# Patient Record
Sex: Male | Born: 1935 | Race: White | Hispanic: No | Smoking: Former smoker
Health system: Southern US, Community
[De-identification: ages and names within clinical notes are randomized; demographics above are authoritative.]

## PROBLEM LIST (undated history)

## (undated) DIAGNOSIS — I10 Essential (primary) hypertension: Secondary | ICD-10-CM

## (undated) DIAGNOSIS — I252 Old myocardial infarction: Secondary | ICD-10-CM

## (undated) DIAGNOSIS — E119 Type 2 diabetes mellitus without complications: Secondary | ICD-10-CM

## (undated) DIAGNOSIS — E785 Hyperlipidemia, unspecified: Secondary | ICD-10-CM

## (undated) DIAGNOSIS — I493 Ventricular premature depolarization: Secondary | ICD-10-CM

## (undated) DIAGNOSIS — I639 Cerebral infarction, unspecified: Secondary | ICD-10-CM

## (undated) DIAGNOSIS — I4510 Unspecified right bundle-branch block: Secondary | ICD-10-CM

## (undated) HISTORY — PX: APPENDECTOMY: SHX54

## (undated) HISTORY — PX: HERNIA REPAIR: SHX51

## (undated) HISTORY — PX: TONSILLECTOMY: SUR1361

## (undated) HISTORY — PX: OTHER SURGICAL HISTORY: SHX169

---

## 2005-05-28 ENCOUNTER — Ambulatory Visit: Payer: Self-pay | Admitting: Internal Medicine

## 2006-01-27 ENCOUNTER — Ambulatory Visit: Payer: Self-pay | Admitting: Family Medicine

## 2006-02-13 ENCOUNTER — Ambulatory Visit: Payer: Self-pay | Admitting: Family Medicine

## 2006-03-15 ENCOUNTER — Ambulatory Visit: Payer: Self-pay | Admitting: Family Medicine

## 2007-04-13 ENCOUNTER — Ambulatory Visit: Payer: Self-pay | Admitting: Unknown Physician Specialty

## 2007-04-28 ENCOUNTER — Ambulatory Visit: Payer: Self-pay | Admitting: Pain Medicine

## 2007-05-31 ENCOUNTER — Ambulatory Visit: Payer: Self-pay | Admitting: Pain Medicine

## 2008-02-29 ENCOUNTER — Other Ambulatory Visit: Payer: Self-pay

## 2008-02-29 ENCOUNTER — Inpatient Hospital Stay: Payer: Self-pay | Admitting: Specialist

## 2008-03-12 ENCOUNTER — Emergency Department: Payer: Self-pay | Admitting: Emergency Medicine

## 2008-03-12 ENCOUNTER — Other Ambulatory Visit: Payer: Self-pay

## 2008-03-16 ENCOUNTER — Emergency Department: Payer: Self-pay | Admitting: Emergency Medicine

## 2009-03-16 ENCOUNTER — Observation Stay: Payer: Self-pay | Admitting: Internal Medicine

## 2009-03-24 ENCOUNTER — Emergency Department: Payer: Self-pay | Admitting: Internal Medicine

## 2009-04-04 ENCOUNTER — Emergency Department: Payer: Self-pay | Admitting: Emergency Medicine

## 2009-06-02 ENCOUNTER — Ambulatory Visit: Payer: Self-pay | Admitting: Sports Medicine

## 2009-07-25 ENCOUNTER — Ambulatory Visit: Payer: Self-pay | Admitting: Urology

## 2009-07-31 ENCOUNTER — Ambulatory Visit: Payer: Self-pay | Admitting: Urology

## 2010-07-22 ENCOUNTER — Ambulatory Visit: Payer: Self-pay | Admitting: Unknown Physician Specialty

## 2010-07-24 LAB — PATHOLOGY REPORT

## 2010-08-22 ENCOUNTER — Ambulatory Visit: Payer: Self-pay | Admitting: Internal Medicine

## 2012-08-16 DIAGNOSIS — N390 Urinary tract infection, site not specified: Secondary | ICD-10-CM | POA: Insufficient documentation

## 2012-08-16 DIAGNOSIS — R339 Retention of urine, unspecified: Secondary | ICD-10-CM | POA: Insufficient documentation

## 2012-08-16 DIAGNOSIS — N138 Other obstructive and reflux uropathy: Secondary | ICD-10-CM | POA: Insufficient documentation

## 2012-08-16 DIAGNOSIS — R310 Gross hematuria: Secondary | ICD-10-CM | POA: Insufficient documentation

## 2012-12-03 ENCOUNTER — Inpatient Hospital Stay: Payer: Self-pay | Admitting: Internal Medicine

## 2012-12-03 LAB — COMPREHENSIVE METABOLIC PANEL
Anion Gap: 9 (ref 7–16)
Bilirubin,Total: 1.5 mg/dL — ABNORMAL HIGH (ref 0.2–1.0)
Calcium, Total: 8.1 mg/dL — ABNORMAL LOW (ref 8.5–10.1)
Chloride: 101 mmol/L (ref 98–107)
Co2: 24 mmol/L (ref 21–32)
EGFR (African American): >60
Potassium: 3.9 mmol/L (ref 3.5–5.1)
SGOT(AST): 22 U/L (ref 15–37)
SGPT (ALT): 18 U/L (ref 12–78)
Sodium: 134 mmol/L — ABNORMAL LOW (ref 136–145)
Total Protein: 6.6 g/dL (ref 6.4–8.2)

## 2012-12-03 LAB — URINALYSIS, COMPLETE
Glucose,UR: NEGATIVE mg/dL (ref 0–75)
Nitrite: POSITIVE
Ph: 5 (ref 4.5–8.0)
Protein: 30
RBC,UR: 2 /HPF (ref 0–5)
WBC UR: 2 /HPF (ref 0–5)

## 2012-12-03 LAB — CBC WITH DIFFERENTIAL/PLATELET
Eosinophil #: 0 10*3/uL (ref 0.0–0.7)
HCT: 40.3 % (ref 40.0–52.0)
HGB: 13.9 g/dL (ref 13.0–18.0)
Lymphocyte %: 2.7 %
MCHC: 34.4 g/dL (ref 32.0–36.0)
Monocyte #: 2.1 x10 3/mm — ABNORMAL HIGH (ref 0.2–1.0)
Neutrophil #: 16.6 10*3/uL — ABNORMAL HIGH (ref 1.4–6.5)
Neutrophil %: 86.2 %
RBC: 4.23 10*6/uL — ABNORMAL LOW (ref 4.40–5.90)
RDW: 13 % (ref 11.5–14.5)

## 2012-12-03 LAB — CK TOTAL AND CKMB (NOT AT ARMC): CK-MB: <0.5 ng/mL — ABNORMAL LOW (ref 0.5–3.6)

## 2012-12-04 LAB — BASIC METABOLIC PANEL
Anion Gap: 6 — ABNORMAL LOW (ref 7–16)
Calcium, Total: 8.3 mg/dL — ABNORMAL LOW (ref 8.5–10.1)
Chloride: 103 mmol/L (ref 98–107)
Co2: 28 mmol/L (ref 21–32)
Creatinine: 0.88 mg/dL (ref 0.60–1.30)
EGFR (African American): >60
EGFR (Non-African Amer.): >60
Potassium: 3.9 mmol/L (ref 3.5–5.1)

## 2012-12-04 LAB — CBC WITH DIFFERENTIAL/PLATELET
Basophil #: 0 10*3/uL (ref 0.0–0.1)
Eosinophil #: 0 10*3/uL (ref 0.0–0.7)
HCT: 38.7 % — ABNORMAL LOW (ref 40.0–52.0)
HGB: 12.8 g/dL — ABNORMAL LOW (ref 13.0–18.0)
Lymphocyte #: 1 10*3/uL (ref 1.0–3.6)
MCHC: 33 g/dL (ref 32.0–36.0)
Monocyte #: 2 x10 3/mm — ABNORMAL HIGH (ref 0.2–1.0)
Monocyte %: 10.2 %
Neutrophil #: 16.4 10*3/uL — ABNORMAL HIGH (ref 1.4–6.5)
Neutrophil %: 84.8 %
Platelet: 155 10*3/uL (ref 150–440)
RBC: 4.04 10*6/uL — ABNORMAL LOW (ref 4.40–5.90)
RDW: 13.1 % (ref 11.5–14.5)

## 2012-12-05 LAB — CBC WITH DIFFERENTIAL/PLATELET
Basophil #: 0 10*3/uL (ref 0.0–0.1)
Basophil %: 0.3 %
Eosinophil %: 0.1 %
HCT: 39.2 % — ABNORMAL LOW (ref 40.0–52.0)
HGB: 13.6 g/dL (ref 13.0–18.0)
Lymphocyte #: 0.6 10*3/uL — ABNORMAL LOW (ref 1.0–3.6)
Lymphocyte %: 4.1 %
MCH: 33.1 pg (ref 26.0–34.0)
MCHC: 34.7 g/dL (ref 32.0–36.0)
MCV: 95 fL (ref 80–100)
Monocyte %: 8.9 %
Neutrophil #: 12.3 10*3/uL — ABNORMAL HIGH (ref 1.4–6.5)
Neutrophil %: 86.6 %
Platelet: 150 10*3/uL (ref 150–440)
RDW: 13.2 % (ref 11.5–14.5)

## 2012-12-06 LAB — URINE CULTURE

## 2012-12-09 LAB — CULTURE, BLOOD (SINGLE)

## 2013-05-17 ENCOUNTER — Ambulatory Visit: Payer: Self-pay | Admitting: Ophthalmology

## 2013-05-25 ENCOUNTER — Ambulatory Visit: Payer: Self-pay | Admitting: Ophthalmology

## 2013-06-22 ENCOUNTER — Ambulatory Visit: Payer: Self-pay | Admitting: Ophthalmology

## 2013-08-17 DIAGNOSIS — R972 Elevated prostate specific antigen [PSA]: Secondary | ICD-10-CM | POA: Insufficient documentation

## 2013-08-17 DIAGNOSIS — N3020 Other chronic cystitis without hematuria: Secondary | ICD-10-CM | POA: Insufficient documentation

## 2013-12-08 ENCOUNTER — Observation Stay: Payer: Self-pay | Admitting: Internal Medicine

## 2013-12-08 LAB — BASIC METABOLIC PANEL
ANION GAP: 6 — AB (ref 7–16)
BUN: 15 mg/dL (ref 7–18)
CO2: 28 mmol/L (ref 21–32)
CREATININE: 1.08 mg/dL (ref 0.60–1.30)
Calcium, Total: 8.5 mg/dL (ref 8.5–10.1)
Chloride: 103 mmol/L (ref 98–107)
EGFR (African American): >60
GFR CALC NON AF AMER: 59 — AB
Glucose: 117 mg/dL — ABNORMAL HIGH (ref 65–99)
Osmolality: 276 (ref 275–301)
Potassium: 4.2 mmol/L (ref 3.5–5.1)
Sodium: 137 mmol/L (ref 136–145)

## 2013-12-08 LAB — CBC
HCT: 41.6 % (ref 40.0–52.0)
HGB: 14.1 g/dL (ref 13.0–18.0)
MCH: 32.3 pg (ref 26.0–34.0)
MCHC: 33.8 g/dL (ref 32.0–36.0)
MCV: 96 fL (ref 80–100)
PLATELETS: 180 10*3/uL (ref 150–440)
RBC: 4.35 10*6/uL — ABNORMAL LOW (ref 4.40–5.90)
RDW: 13.5 % (ref 11.5–14.5)
WBC: 6.5 10*3/uL (ref 3.8–10.6)

## 2013-12-08 LAB — HEPATIC FUNCTION PANEL A (ARMC)
ALT: 20 U/L (ref 12–78)
Albumin: 3.7 g/dL (ref 3.4–5.0)
Alkaline Phosphatase: 73 U/L
Bilirubin, Direct: 0.2 mg/dL (ref 0.00–0.20)
Bilirubin,Total: 0.8 mg/dL (ref 0.2–1.0)
SGOT(AST): 21 U/L (ref 15–37)
TOTAL PROTEIN: 7.1 g/dL (ref 6.4–8.2)

## 2013-12-08 LAB — MAGNESIUM: MAGNESIUM: 1.9 mg/dL

## 2013-12-08 LAB — TROPONIN I: Troponin-I: <0.02 ng/mL

## 2013-12-08 LAB — D-DIMER(ARMC): D-Dimer: 628 ng/ml

## 2013-12-09 LAB — T4, FREE: Free Thyroxine: 0.91 ng/dL (ref 0.76–1.46)

## 2013-12-09 LAB — TROPONIN I

## 2013-12-09 LAB — TSH: Thyroid Stimulating Horm: 5 u[IU]/mL — ABNORMAL HIGH

## 2014-09-13 ENCOUNTER — Ambulatory Visit: Payer: Self-pay | Admitting: Neurology

## 2015-01-05 NOTE — H&P (Signed)
PATIENT NAME:  Benjamin Parks, Benjamin Parks MR#:  [id number] DATE OF BIRTH:  20-Sep-1918  DATE OF ADMISSION:  12/03/2012  PRIMARY CARE PHYSICIAN:  Dr. Tera MaterJim Hedrick.   UROLOGY:  Dr. Assunta GamblesBrian Cope.   REQUESTING PHYSICIAN:  Dr. Maricela BoLuna Ragsdale.   CHIEF COMPLAINT:  Shortness of breath.   HISTORY OF PRESENT ILLNESS:  The patient is a 81 year old male with a known history of CVA, diet-controlled diabetes, is being admitted for bilateral pneumonia.  He lives at [facility] where he had some blood in the urine, had a urology office visit who requested CT scan of the abdomen with IV contrast which he got it done on the 18th, had a follow-up appointment with Dr. Achilles Dunkope on the 19th who did not find any abnormality.  He did not have any kidney stones as per patient information.  The patient started having some uncomfortable feeling all day on the 20th and started having some trouble breathing.  He was feeling that he likely did not clear some of the dye that he received for the CT scan, so started drinking a lot of fluid, but now his breathing was getting worse and decided to come to the Emergency Department.  While in the ED he was found to be hypoxic with room air oxygen saturations of 93% at rest and 91% on ambulation.  He was found to have bilateral pneumonia on chest x-ray and is being admitted for further evaluation and management.   PAST MEDICAL HISTORY: 1.  Hyperlipidemia.  2.  History of TIA/CVA.  3.  Hypertension.  4.  Diet-controlled diabetes.   PAST SURGICAL HISTORY:  None.   MEDICATIONS AT HOME: 1.  Aggrenox 1 tablet by mouth twice daily.  2.  Atenolol 12.5 mg by mouth daily.  3.  Citalopram 10 mg by mouth daily.  4.  Finasteride 5 mg by mouth daily. 5.  Zetia 10 mg by mouth daily.   ALLERGIES:  ERYTHROMYCIN.   SOCIAL HISTORY:  He lives at [facility] with his wife who has a lot of heart problems.  He is a nonsmoker.  Occasional alcohol use.  No IV drugs of abuse.    FAMILY HISTORY:  Both parents died at an old age, possibly of heart disease.   REVIEW OF SYSTEMS: CONSTITUTIONAL:  No fever.  Positive for fatigue and weakness.  EYES:  No blurred or double vision.  EARS, NOSE, THROAT:  No tinnitus or ear pain.  RESPIRATORY:  No cough, wheezing, hemoptysis.  Positive for shortness of breath.  CARDIOVASCULAR:  No chest pain, orthopnea, edema.  GASTROINTESTINAL:  No nausea, vomiting, diarrhea. GENITOURINARY:  No dysuria, hematuria. ENDOCRINE:  No polyuria or nocturia.  HEMATOLOGY:  No anemia or easy bruising.  SKIN:  No rash or lesion.  MUSCULOSKELETAL:  No arthritis or muscle cramp.  NEUROLOGIC:  No tingling, numbness, weakness.  History of TIA and/or CVA.  PSYCHIATRIC:  No history of anxiety or depression.   PHYSICAL EXAMINATION: VITAL SIGNS:  Temperature 97.4, heart rate 67 per minute, respirations 16 per minute, blood pressure 113/50 mmHg.  He is saturating 91% on room air and 94% to 95% on 2 liters oxygen by nasal cannula.  GENERAL:  The patient is a 81 year old male lying in the bed comfortably without any acute distress.  EYES: Pupils equal, round, reactive to light and accommodation.  No scleral icterus.  Extraocular muscles intact.  HENT:  Head atraumatic, normocephalic.  Oropharynx and nasopharynx clear.  NECK:  Supple.  No jugular  venous distention.  No thyroid enlargement or tenderness.  LUNGS:  Clear to auscultation bilaterally.  No rales, rhonchi or crepitation.  CARDIOVASCULAR:  S1 and S2 normal.  No murmurs, rubs or gallop.  ABDOMEN:  Soft, nontender, nondistended.  Bowel sounds present.  No organomegaly, masses.  EXTREMITIES:  No pedal edema, cyanosis, clubbing.  NEUROLOGIC:  Cranial nerves II through XII intact.  Muscle strength 5 out of 5 in all extremities.  Sensation intact. PSYCHIATRIC:  The patient is oriented to time, place, and person x 3.  SKIN:  No obvious rash or lesion.   LABORATORY DATA:  Normal BMP.  Normal liver  function tests except total bilirubin of 1.5.  Normal first set of cardiac enzymes.  Normal CBC except white count of 19.3.  UA showed 3+ leukocyte esterase, positive nitrite, 2+ WBCs, 2+ bacteria.    Chest x-ray shows mild basilar infiltrate.    EKG shows normal sinus rhythm, right bundle branch block.  No major ST-T changes.  His EKG is unchanged from his previous EKG back in July of 2010.   IMPRESSION AND PLAN:   1.  Pneumonia, bilateral, was started on IV Levaquin.  Obtain blood and sputum culture.  We will check urine Legionella antigen.  Also check mycoplasma antigen.  We will also check influenza test.  2.  Urinary tract infection, based on UA.  We will check urine culture, sensitivity.  3.  Diabetes, diet-controlled.  We will check hemoglobin A1c.  4.  History of transient ischemic attack/cerebrovascular accident.  We will continue Aggrenox and Zetia.  5.  Hyperlipidemia.  We will continue Zetia.   CODE STATUS:  FULL CODE.   TOTAL TIME TAKING CARE OF THIS PATIENT:  Is 55 minutes.     ____________________________ Ellamae Sia. Sherryll Burger, MD vss:ea D: 12/03/2012 22:40:51 ET T: 12/04/2012 03:56:26 ET JOB#: [id number]  cc: Shadoe Bethel S. Sherryll Burger, MD, <Dictator> Rhona Leavens. Burnett Sheng, MD Ellamae Sia [facility] MD ELECTRONICALLY SIGNED 12/05/2012 16:23

## 2015-01-05 NOTE — Op Note (Signed)
PATIENT NAME:  Benjamin Parks, Benjamin Parks MR#:  [id number] DATE OF BIRTH:  1918-10-28  DATE OF PROCEDURE:  05/25/2013  PREOPERATIVE DIAGNOSIS:  Senile cataract, right eye.  POSTOPERATIVE DIAGNOSIS:  Senile cataract, right eye.  PROCEDURE:  Phacoemulsification with posterior chamber intraocular lens implantation of the right eye.  LENS:  ZCBOO 24.0-diopter posterior chamber intraocular lens.  ULTRASOUND TIME:  19% of 1 minute, 56 seconds.  CDE 21.3.  SURGEON:  It[place]had Tramel Westbrook, MD  ANESTHESIA:  Topical with tetracaine drops and 2% Xylocaine jelly.  COMPLICATIONS:  None.  DESCRIPTION OF PROCEDURE:  The patient was identified in the holding room and transported to the operating room and placed in the supine position under the operating microscope.  The right eye was identified as the operative eye and it was prepped and draped in the usual sterile ophthalmic fashion.  A 1 millimeter clear-corneal paracentesis was made at the 12 o'clock position.  The anterior chamber was filled with Viscoat viscoelastic.  A 2.4 millimeter keratome was used to make a near-clear corneal incision at the 9 o'clock position.  A curvilinear capsulorrhexis was made with a cystotome and capsulorrhexis forceps.  Balanced salt solution was used to hydrodissect and hydrodelineate the nucleus.  Phacoemulsification was then used in stop and chop fashion to remove the lens nucleus and epinucleus.  The remaining cortex was then removed using the irrigation and aspiration handpiece. Provisc was then placed into the capsular bag to distend it for lens placement.  A ZCBOO 24.0-diopter lens was then injected into the capsular bag.  The remaining viscoelastic was aspirated.  Wounds were hydrated with balanced salt solution.  The anterior chamber was inflated to a physiologic pressure with balanced salt solution.  0.1 mL of cefuroxime 10 mg/mL were injected into the anterior chamber for a dose of 1 mg of intracameral antibiotic at the  completion of the case. Miostat was placed into the anterior chamber to constrict the pupil.  No wound leaks were noted.  Topical Vigamox drops and Maxitrol ointment were applied to the eye.  The patient was taken to the recovery room in stable condition without complications of anesthesia or surgery.    ____________________________ Deirdre Evenerhadwick R. Javonnie Illescas, MD crb:dm D: 05/25/2013 14:42:46 ET T: 05/25/2013 14:51:31 ET JOB#: [id number]  cc: Deirdre Evenerhadwick R. Myrl Bynum, MD, <Dictator>   Lockie MolaHADWICK Laia Wiley MD ELECTRONICALLY SIGNED 06/01/2013 10:58

## 2015-01-05 NOTE — Discharge Summary (Signed)
PATIENT NAME:  Alysia PennaLMER, Christen MR#:  [id number] DATE OF BIRTH:  November 30, 1918  DATE OF ADMISSION:  12/03/2012 DATE OF DISCHARGE:  12/05/2012  PRIMARY CARE PHYSICIAN:  Dr. Jerl MinaJames Hedrick.   FINAL DIAGNOSES: 1.  Pneumonia and leukocytosis.  2.  Suspected urinary tract infection.  3.  Hypertension.  4.  History of cerebrovascular accident.  5.  Benign prostatic hypertrophy.  6.  Hyperlipidemia.   MEDICATIONS ON DISCHARGE: Include:  Finasteride 5 mg daily, Celexa 10 mg daily, Zetia 10 mg daily, Aggrenox 1 tablet twice a day, atenolol 12.5 mg daily, levofloxacin 500 mg 1 tablet daily for 8 more days.   ACTIVITY: As tolerated.   FOLLOWUP: In 1 to 2 weeks with Dr. Jerl MinaJames Hedrick.   REASON FOR ADMISSION: The patient was admitted December 03, 2012, and discharged December 05, 2012, came in with shortness of breath.   HISTORY OF PRESENT ILLNESS: The patient is a 81 year old man, history of CVA, came in with shortness of breath and was found to have bilateral pneumonia. He was admitted to the hospital and started on IV Levaquin.   LABORATORY AND RADIOLOGICAL DATA DURING THE HOSPITAL COURSE: Included a chest x-ray showed mild bibasal infiltrate. Urine culture colonies too small to read. Urinalysis 3+ leukocyte esterase, positive nitrites. Troponin negative. Glucose 192, BUN 15, creatinine 0.95, sodium 134, potassium 3.9, chloride 101, CO2 24, calcium 8.1, total bilirubin 1.5, alkaline phosphatase 61, ALT 18, AST 22, albumin 3.3. White blood cell count 19.3, H and H 13.9 and 40.3, platelet count 157. Blood cultures negative. Influenza negative. White count upon discharge 14.1.   HOSPITAL COURSE PER PROBLEM LIST:  1.  For the patient's pneumonia and leukocytosis, the patient was started on IV Levaquin. He was switched over to oral Levaquin. His white count lessened a bit, down to 14 upon discharge. The patient was feeling better. The patient had a temperature of 99.2 upon discharge, heart rate 64, blood pressure  119/76. The patient's vital signs were stable. The patient was clinically stable, discharged home in satisfactory condition.  2.  For suspected UTI, urine culture did not grow out anything, colonies too small to grow Levaquin would cover anyway.  3.  Hypertension. The patient is on atenolol.  4.  History of CVA. On Aggrenox.  5.  BPH, on finasteride.  6.  Hyperlipidemia, on Zetia.   TIME SPENT ON DISCHARGE: 35 minutes.     ____________________________ Herschell Dimesichard J. Renae GlossWieting, MD rjw:cs D: 12/05/2012 13:50:28 ET T: 12/05/2012 14:50:56 ET JOB#: [id number]  cc: Herschell Dimesichard J. Renae GlossWieting, MD, <Dictator> Rhona LeavensJames F. Burnett ShengHedrick, MD Salley ScarletICHARD J Jakyia Gaccione MD ELECTRONICALLY SIGNED 12/15/2012 10:30

## 2015-01-05 NOTE — Op Note (Signed)
PATIENT NAME:  Benjamin Parks, Benjamin Parks MR#:  [id number] DATE OF BIRTH:  01/14/1919  DATE OF PROCEDURE:  06/22/2013  PREOPERATIVE DIAGNOSIS:  Senile cataract, left eye.  POSTOPERATIVE DIAGNOSIS:  Senile cataract, left eye.  PROCEDURE:  Phacoemulsification with posterior chamber intraocular lens implantation of the left eye.  LENS:  ZCB00 24.5-diopter posterior chamber intraocular lens.  ULTRASOUND TIME:  10% of 1 minute, 25 seconds.  CDE 8.1.  SURGEON:  Italyhad Tyniesha Howald, MD  ANESTHESIA:  Topical with tetracaine drops and 2% Xylocaine jelly.  COMPLICATIONS:  None.  DESCRIPTION OF PROCEDURE:  The patient was identified in the holding room and transported to the operating room and placed in the supine position under the operating microscope.  The left eye was identified as the operative eye and it was prepped and draped in the usual sterile ophthalmic fashion.  A 1 millimeter clear-corneal paracentesis was made at the 1:30 position.  The anterior chamber was filled with  Viscoat viscoelastic.  A 2.4 millimeter keratome was used to make a near-clear corneal incision at the  10:30 position.  A curvilinear capsulorrhexis was made with a cystotome and capsulorrhexis forceps.  Balanced salt solution was used to hydrodissect and hydrodelineate the nucleus.  Phacoemulsification was then used in stop and chop fashion to remove the lens nucleus and epinucleus.  The remaining cortex was then removed using the irrigation and aspiration handpiece. Provisc was then placed into the capsular bag to distend it for lens placement.  A ZCB00 24.5-diopter lens was then injected into the capsular bag.  The remaining viscoelastic was aspirated.  Wounds were hydrated with balanced salt solution.  The anterior chamber was inflated to a physiologic pressure with balanced salt solution.  0.1 mL of cefuroxime 10 mg/mL were injected into the anterior chamber for a dose of 1 mg of intracameral antibiotic at the completion of the  case. Miostat was placed into the anterior chamber to constrict the pupil.  No wound leaks were noted.  Topical Vigamox drops and Maxitrol ointment were applied to the eye.  The patient was taken to the recovery room in stable condition without complications of anesthesia or surgery.    ____________________________ Deirdre Evenerhadwick R. Evangelene Vora, MD crb:dmm D: 06/22/2013 15:25:00 ET T: 06/22/2013 16:32:05 ET JOB#: [id number]  cc: Deirdre Evenerhadwick R. Graciela Plato, MD, <Dictator> Lockie MolaHADWICK Nichael Ehly MD ELECTRONICALLY SIGNED 06/29/2013 12:48

## 2015-01-06 NOTE — H&P (Signed)
PATIENT NAME:  Benjamin Parks, Benjamin Parks MR#:  [id number] DATE OF BIRTH:  [birth year]  DATE OF ADMISSION:  12/08/2013  REFERRING PHYSICIAN:  Girtha HakeGotlib.  PRIMARY CARE PHYSICIAN:  Hedrick.   CHIEF COMPLAINT: Shortness of breath.   HISTORY OF PRESENT ILLNESS: This is a 81 year old Caucasian gentleman with past medical history of hyperlipidemia, cerebral vascular accident without residual deficit, presented with shortness of breath. Events of the day, the patient was singing in choir practice and noticed difficulty " getting air in and catching his breath. " He manually checked his pulse and noted that he was "missing beats." Thus presented to the Emergency Department for further work-up and evaluation. He denies any further symptoms. No chest pain. No cough. No active shortness of breath. This event lasted a few minutes only. He denies any cough. Denies any immobilization, recent surgeries, active cancer. He is a nontobacco user. He denies any lower extremity edema. Currently he is without complaints stating that his symptoms had completely resolved by the time he arrived to the Emergency Department.   REVIEW OF SYSTEMS:   CONSTITUTIONAL: Denies fever, fatigue, weakness.  EYES:  Denies blurry, double vision or eye pain.  EARS:   Denies tinnitus, ear pain or hearing loss.   RESPIRATORY: Denies cough, wheeze. Positive for transient short of breath as described above.  CARDIOVASCULAR: No chest pain, palpitations, edema.  GASTROINTESTINAL: Denies nausea, vomiting, diarrhea or vomiting.  GENITOURINARY: Denies dysuria, hematuria.  ENDOCRINE: Denies nocturia or thyroid problem.  Denies easy bruising or bleeding.  SKIN: Denies rash or lesions.  MUSCULOSKELETAL: Denies pain in neck, back, shoulder, knees, hips or arthritis symptoms.  NEUROLOGIC: Denies paralysis or paresthesias.  PSYCHIATRIC: Denies anxiety or depressive symptoms.  Otherwise, full review of systems performed and is negative.    PAST MEDICAL HISTORY:  Diabetes, diet controlled, BPH, CVA hyperlipidemia.   SOCIAL HISTORY: Positive for alcohol usage occasionally. Denies any tobacco or drug usage.   FAMILY HISTORY: Positive for coronary artery disease.   ALLERGIES: SHELLFISH.   HOME MEDICATIONS: Finasteride 5 mg p.o. daily, citalopram 10 mg p.o. daily, Zetia 10 mg p.o. daily, Aggrenox 25/200 mg p.o. b.i.d., atenolol 25 mg half tablet daily though he states he is taking this at 25 mg half tablet b.i.d.   PHYSICAL EXAMINATION:  VITAL SIGNS: Temperature 97, heart rate on arrival 42, currently 63, respirations 20, blood pressure 150/57, saturating 98% on room air. Weight 77.1 kg.  BMI 24.4.  GENERAL: Well-nourished, well-developed, Caucasian gentleman, currently in no acute distress.  HEAD: Normocephalic, atraumatic.  EYES: Pupils equal, round and reactive to light. Extraocular muscles are intact. No scleral icterus.  MOUTH: Moist mucous membranes. Dentition intact. No abscess noted.  EARS, NOSE and THROAT: No exudates. No external lesions.   NECK: Supple. No thyromegaly. No nodules. No JVD.  PULMONARY:  Clear to auscultation bilaterally without wheezes, rales or rhonchi. No use of accessory muscles. Good respiratory effort.  CHEST:  Nontender to palpation.  CARDIOVASCULAR: S1, S2, bradycardic without murmurs, rubs, or gallops. No edema. Pedal pulses are 2+ bilaterally.  GASTROINTESTINAL: Soft, nontender, nondistended. No masses. Normal bowel sounds.  No hepatosplenomegaly.  MUSCULOSKELETAL: No swelling, clubbing, edema. Range of motion is full in all extremities.  NEUROLOGIC:  Cranial nerves II-XII are intact. No gross neurological deficits. Sensation intact. Reflexes intact.  SKIN:  No ulceration, lesions, rashes, cyanosis. Skin warm and dry. Turgor intact.  PSYCHIATRIC: Mood and affect within normal limits. The patient alert and oriented x 3. Insight and judgment intact.    LABORATORY  DATA: EKG performed on arrival revealing sinus  bradycardia. Heart rate of 53 with right bundle branch and occasional PVCs.   Remainder of laboratory data: Sodium 137, potassium 4.2, chloride 103, bicarbonate 28, BUN 15, creatinine . Glucose 117. LFTs within normal limits. Troponin I less than 0.02. WBC 6.5, hemoglobin 14.1, platelets 180,000.   D-dimer 628 with interpretation error of 628.   Chest x-ray performed revealing no acute cardiopulmonary process.   ASSESSMENT AND PLAN: A 81 year old gentleman with history of hyperlipidemia, cerebral vascular accident without residual deficits, shortness of breath as described as difficulty catching his breath with heart rate in the 40s on arrival to the Emergency Department.  1.  Symptomatic bradycardia with heart rate in 40s on arrival to the Emergency Department now in the 60s without intervention. We will hold atenolol and further AV nodal agents. His medication is prescribed per our records as atenolol 12.5 mg daily. He states he has been taking this 12.5 mg b.i.d. This may be the underlying problem. We will place on telemetry under observation status. Check TSH. Trend cardiac enzymes. Check magnesium level.  2.  Positive D-dimer, well score of zero. History and physical are not conducive to pulmonary embolism or deep vein thrombosis. I feel that this likely does not require any further work-up and discussed with the patient at length.  3.  Cerebrovascular accident history. Continue Aggrenox.  4.  Deep venous thrombosis  continue heparin subcutaneous.   CODE STATUS:  The patient is full code.   TIME SPENT: 45 minutes   ____________________________ Cletis Athens. Hower, MD dkh:tc D: 12/08/2013 20:34:41 ET T: 12/08/2013 21:24:47 ET JOB#: [id number]  cc: Cletis Athens. Hower, MD, <Dictator> DAVID Synetta Shadow MD ELECTRONICALLY SIGNED 12/09/2013 0:04

## 2015-01-06 NOTE — Discharge Summary (Signed)
PATIENT NAME:  Benjamin Parks, Benjamin Parks MR#:  [id number] DATE OF BIRTH:  1919/06/27  DATE OF ADMISSION:  12/08/2013 DATE OF DISCHARGE:  12/09/2013  PRIMARY CARE PHYSICIAN: Rhona LeavensJames F. Burnett ShengHedrick, MD  DISCHARGE DIAGNOSES:   1.  Symptomatic bradycardia.  2.  Hypertension. 3.  History of cerebrovascular accident.  CONDITION: Stable.   CODE STATUS: DNR.   HOME MEDICATIONS: Please refer to the medication reconciliation list.   DIET: Low-sodium, low-fat, low-cholesterol diet.   ACTIVITY: As tolerated.   FOLLOWUP CARE: Follow up with PCP within 1 to 2 weeks.   REASON FOR ADMISSION: Shortness of breath.   HOSPITAL COURSE: The patient is a [age] year old Caucasian male with a history of hyperlipidemia, CVA, who presented to the ED with shortness of breath. He manually checked his pulses and noted that he was missing beats, so he came to ED for further evaluation. For detailed history and physical examination, please refer to the admission note dictated by Dr. Clint GuyHower. In ED, the patient denied any symptoms. No chest pain, cough, or shortness of breath. Laboratory data on admission date are as follows: EKG showed sinus bradycardia at 53 with a right bundle branch block with occasional PVC. Electrolytes were normal. Chest x-ray did not show any acute cardiopulmonary process.  1.  Symptomatic bradycardia with heart rate at 40s on arrival in ED, then increased to about 60s without intervention. The patient was on atenolol 12.5 mg daily; however, the patient is taking b.i.d. Atenolol was on hold after admission. The patient's heart rate has been stable at about 60s.  2.  Positive D-dimer. The patient's D-dimer is 628. We will resume atenolol 12.5 mg p.o. daily.  3.  History of CVA, on Aggrenox.   The patient had no complaints after admission. His vital signs are stable. He is clinically stable and will be discharged to home today. I discussed the patient's discharge plan with the patient, nurse, case manager.   TIME  SPENT: About 33 minutes.  ____________________________ Benjamin PollackQing Lindel Marcell, MD qc:jcm D: 12/09/2013 15:32:56 ET T: 12/09/2013 18:26:11 ET JOB#: [id number]  cc: Benjamin PollackQing Alhassan Everingham, MD, <Dictator> Benjamin PollackQING Merlina Marchena MD ELECTRONICALLY SIGNED 12/10/2013 14:18

## 2015-11-07 ENCOUNTER — Encounter: Payer: Self-pay | Admitting: *Deleted

## 2015-11-07 ENCOUNTER — Inpatient Hospital Stay
Admit: 2015-11-07 | Discharge: 2015-11-07 | Disposition: A | Discharge status: Home / Self Care | Payer: Medicare Other | Attending: Internal Medicine | Admitting: Internal Medicine

## 2015-11-07 ENCOUNTER — Emergency Department: Payer: Medicare Other

## 2015-11-07 ENCOUNTER — Inpatient Hospital Stay
Admission: EM | Admit: 2015-11-07 | Discharge: 2015-11-09 | DRG: 280 | Disposition: A | Discharge status: Home / Self Care | Payer: Medicare Other | Attending: Internal Medicine | Admitting: Internal Medicine

## 2015-11-07 DIAGNOSIS — Z7901 Long term (current) use of anticoagulants: Secondary | ICD-10-CM | POA: Diagnosis not present

## 2015-11-07 DIAGNOSIS — I214 Non-ST elevation (NSTEMI) myocardial infarction: Secondary | ICD-10-CM | POA: Diagnosis present

## 2015-11-07 DIAGNOSIS — I4510 Unspecified right bundle-branch block: Secondary | ICD-10-CM | POA: Diagnosis present

## 2015-11-07 DIAGNOSIS — J189 Pneumonia, unspecified organism: Secondary | ICD-10-CM | POA: Diagnosis present

## 2015-11-07 DIAGNOSIS — M25519 Pain in unspecified shoulder: Secondary | ICD-10-CM | POA: Diagnosis present

## 2015-11-07 DIAGNOSIS — E1110 Type 2 diabetes mellitus with ketoacidosis without coma: Secondary | ICD-10-CM

## 2015-11-07 DIAGNOSIS — Z881 Allergy status to other antibiotic agents status: Secondary | ICD-10-CM | POA: Diagnosis not present

## 2015-11-07 DIAGNOSIS — Z7902 Long term (current) use of antithrombotics/antiplatelets: Secondary | ICD-10-CM

## 2015-11-07 DIAGNOSIS — E119 Type 2 diabetes mellitus without complications: Secondary | ICD-10-CM | POA: Diagnosis present

## 2015-11-07 DIAGNOSIS — Z7984 Long term (current) use of oral hypoglycemic drugs: Secondary | ICD-10-CM | POA: Diagnosis not present

## 2015-11-07 DIAGNOSIS — Z6825 Body mass index (BMI) 25.0-25.9, adult: Secondary | ICD-10-CM

## 2015-11-07 DIAGNOSIS — I10 Essential (primary) hypertension: Secondary | ICD-10-CM | POA: Diagnosis present

## 2015-11-07 DIAGNOSIS — I493 Ventricular premature depolarization: Secondary | ICD-10-CM | POA: Diagnosis present

## 2015-11-07 DIAGNOSIS — W19XXXA Unspecified fall, initial encounter: Secondary | ICD-10-CM | POA: Diagnosis present

## 2015-11-07 DIAGNOSIS — Z7982 Long term (current) use of aspirin: Secondary | ICD-10-CM

## 2015-11-07 DIAGNOSIS — I249 Acute ischemic heart disease, unspecified: Secondary | ICD-10-CM

## 2015-11-07 DIAGNOSIS — Z8673 Personal history of transient ischemic attack (TIA), and cerebral infarction without residual deficits: Secondary | ICD-10-CM | POA: Diagnosis not present

## 2015-11-07 DIAGNOSIS — Z66 Do not resuscitate: Secondary | ICD-10-CM | POA: Diagnosis present

## 2015-11-07 DIAGNOSIS — E785 Hyperlipidemia, unspecified: Secondary | ICD-10-CM | POA: Diagnosis present

## 2015-11-07 DIAGNOSIS — I209 Angina pectoris, unspecified: Secondary | ICD-10-CM

## 2015-11-07 DIAGNOSIS — Z79899 Other long term (current) drug therapy: Secondary | ICD-10-CM

## 2015-11-07 HISTORY — DX: Hyperlipidemia, unspecified: E78.5

## 2015-11-07 HISTORY — DX: Type 2 diabetes mellitus without complications: E11.9

## 2015-11-07 HISTORY — DX: Cerebral infarction, unspecified: I63.9

## 2015-11-07 HISTORY — DX: Essential (primary) hypertension: I10

## 2015-11-07 HISTORY — DX: Unspecified right bundle-branch block: I45.10

## 2015-11-07 HISTORY — DX: Ventricular premature depolarization: I49.3

## 2015-11-07 LAB — BASIC METABOLIC PANEL
ANION GAP: 8 (ref 5–15)
BUN: 16 mg/dL (ref 6–20)
CO2: 27 mmol/L (ref 22–32)
Calcium: 9.1 mg/dL (ref 8.9–10.3)
Chloride: 103 mmol/L (ref 101–111)
Creatinine, Ser: 0.85 mg/dL (ref 0.61–1.24)
GFR calc Af Amer: >60 mL/min (ref >60)
GLUCOSE: 164 mg/dL — AB (ref 65–99)
POTASSIUM: 3.9 mmol/L (ref 3.5–5.1)
Sodium: 138 mmol/L (ref 135–145)

## 2015-11-07 LAB — GLUCOSE, CAPILLARY
GLUCOSE-CAPILLARY: 139 mg/dL — AB (ref 65–99)
GLUCOSE-CAPILLARY: 162 mg/dL — AB (ref 65–99)
Glucose-Capillary: 105 mg/dL — ABNORMAL HIGH (ref 65–99)

## 2015-11-07 LAB — CBC
HEMATOCRIT: 46.2 % (ref 40.0–52.0)
HEMOGLOBIN: 15.5 g/dL (ref 13.0–18.0)
MCH: 30.9 pg (ref 26.0–34.0)
MCHC: 33.5 g/dL (ref 32.0–36.0)
MCV: 92.2 fL (ref 80.0–100.0)
Platelets: 201 10*3/uL (ref 150–440)
RBC: 5.02 MIL/uL (ref 4.40–5.90)
RDW: 13.3 % (ref 11.5–14.5)
WBC: 5.8 10*3/uL (ref 3.8–10.6)

## 2015-11-07 LAB — LIPID PANEL
CHOL/HDL RATIO: 3.7 ratio
Cholesterol: 164 mg/dL (ref 0–200)
HDL: 44 mg/dL (ref >40)
LDL CALC: 100 mg/dL — AB (ref 0–99)
Triglycerides: 98 mg/dL (ref <150)
VLDL: 20 mg/dL (ref 0–40)

## 2015-11-07 LAB — HEMOGLOBIN A1C: Hgb A1c MFr Bld: 7.2 % — ABNORMAL HIGH (ref 4.0–6.0)

## 2015-11-07 LAB — MRSA PCR SCREENING: MRSA BY PCR: NEGATIVE

## 2015-11-07 LAB — TROPONIN I
TROPONIN I: 7.58 ng/mL — AB (ref <0.031)
Troponin I: 0.1 ng/mL — ABNORMAL HIGH (ref <0.031)
Troponin I: 2.45 ng/mL — ABNORMAL HIGH (ref <0.031)
Troponin I: 12.29 ng/mL — ABNORMAL HIGH (ref <0.031)

## 2015-11-07 MED ORDER — ENOXAPARIN SODIUM 80 MG/0.8ML ~~LOC~~ SOLN
1.0000 mg/kg | Freq: Two times a day (BID) | SUBCUTANEOUS | Status: DC
  Administered 2015-11-07 – 2015-11-09 (×5): 80 mg via SUBCUTANEOUS
  Filled 2015-11-07 (×8): qty 0.8

## 2015-11-07 MED ORDER — MORPHINE SULFATE (PF) 2 MG/ML IV SOLN
INTRAVENOUS | Status: AC
  Administered 2015-11-07: 2 mg via INTRAVENOUS
  Filled 2015-11-07: qty 1

## 2015-11-07 MED ORDER — LEVOFLOXACIN IN D5W 750 MG/150ML IV SOLN
750.0000 mg | INTRAVENOUS | Status: DC
  Administered 2015-11-07: 750 mg via INTRAVENOUS
  Filled 2015-11-07 (×2): qty 150

## 2015-11-07 MED ORDER — NITROGLYCERIN 0.4 MG SL SUBL
0.4000 mg | SUBLINGUAL_TABLET | SUBLINGUAL | Status: DC | PRN
  Administered 2015-11-09 (×2): 0.4 mg via SUBLINGUAL
  Filled 2015-11-07: qty 1

## 2015-11-07 MED ORDER — ASPIRIN EC 81 MG PO TBEC
81.0000 mg | DELAYED_RELEASE_TABLET | Freq: Every day | ORAL | Status: DC
  Administered 2015-11-08 – 2015-11-09 (×3): 81 mg via ORAL
  Filled 2015-11-07 (×3): qty 1

## 2015-11-07 MED ORDER — MORPHINE SULFATE (PF) 2 MG/ML IV SOLN
2.0000 mg | Freq: Once | INTRAVENOUS | Status: AC
  Administered 2015-11-07: 2 mg via INTRAVENOUS

## 2015-11-07 MED ORDER — ASPIRIN 300 MG RE SUPP: 300.0000 mg | RECTAL | Status: AC

## 2015-11-07 MED ORDER — INSULIN ASPART 100 UNIT/ML ~~LOC~~ SOLN
0.0000 [IU] | Freq: Three times a day (TID) | SUBCUTANEOUS | Status: DC
  Administered 2015-11-08: 2 [IU] via SUBCUTANEOUS
  Filled 2015-11-07 (×2): qty 2

## 2015-11-07 MED ORDER — DOCUSATE SODIUM 100 MG PO CAPS: 100.0000 mg | ORAL_CAPSULE | Freq: Every day | ORAL | Status: DC | PRN

## 2015-11-07 MED ORDER — SODIUM CHLORIDE 0.9 % IV SOLN
INTRAVENOUS | Status: DC
  Administered 2015-11-07 – 2015-11-08 (×3): via INTRAVENOUS

## 2015-11-07 MED ORDER — INSULIN ASPART 100 UNIT/ML ~~LOC~~ SOLN: 0.0000 [IU] | Freq: Every day | SUBCUTANEOUS | Status: DC

## 2015-11-07 MED ORDER — METOPROLOL TARTRATE 25 MG PO TABS
25.0000 mg | ORAL_TABLET | Freq: Two times a day (BID) | ORAL | Status: DC
  Administered 2015-11-07 – 2015-11-09 (×3): 25 mg via ORAL
  Filled 2015-11-07 (×3): qty 1

## 2015-11-07 MED ORDER — ONDANSETRON HCL 4 MG/2ML IJ SOLN: 4.0000 mg | Freq: Four times a day (QID) | INTRAMUSCULAR | Status: DC | PRN

## 2015-11-07 MED ORDER — ASPIRIN 81 MG PO CHEW: 324.0000 mg | CHEWABLE_TABLET | ORAL | Status: AC

## 2015-11-07 MED ORDER — MORPHINE SULFATE (PF) 2 MG/ML IV SOLN
2.0000 mg | INTRAVENOUS | Status: DC | PRN
  Administered 2015-11-07 – 2015-11-09 (×3): 2 mg via INTRAVENOUS
  Filled 2015-11-07 (×3): qty 1

## 2015-11-07 MED ORDER — ATORVASTATIN CALCIUM 20 MG PO TABS
20.0000 mg | ORAL_TABLET | Freq: Every day | ORAL | Status: DC
  Administered 2015-11-07: 20 mg via ORAL
  Filled 2015-11-07: qty 1

## 2015-11-07 MED ORDER — ACETAMINOPHEN 325 MG PO TABS: 650.0000 mg | ORAL_TABLET | ORAL | Status: DC | PRN

## 2015-11-07 NOTE — ED Notes (Signed)
MD at bedside. 

## 2015-11-07 NOTE — ED Provider Notes (Signed)
[facility] Emergency Department Provider Note  ____________________________________________  Time seen: Approximately 2:20 AM  I have reviewed the triage vital signs and the nursing notes.   HISTORY  Chief Complaint Chest Pain    HPI Benjamin Parks is a 82 y.o. male who comes into the hospital today with chest pain. The patient reports that he has some left-sided chest pain that started around midnight. The patient was laying in bed when the pain started. He has never had pain like this in the past. He does endorse some shortness of breath but denies nausea vomiting or dizziness. He has not had some sweats either. The patient reports that the pain feels like a pressure and he rates it 8 out of 10 in intensity. He was given aspirin as well as nitroglycerin paste by EMS but he feels that the pain is getting worse and worse. The patient did have a fall on Saturday and fell on his back but not on his chest or his side. The patient took some tramadol before going to bed for some shoulder pain but does not think that had anything to do with his symptoms.   Past Medical History  Diagnosis Date  . Right bundle branch block   . PVC (premature ventricular contraction)   . Diabetes mellitus without complication (HCC)   . Hypertension   . Hyperlipidemia   . Stroke [facility])     Patient Active Problem List   Diagnosis Date Noted  . Non-STEMI (non-ST elevated myocardial infarction) (HCC) 11/07/2015  . Community acquired pneumonia 11/07/2015    Past Surgical History  Procedure Laterality Date  . None      No current outpatient prescriptions on file.  Allergies Azithromycin  Family History  Problem Relation Age of Onset  . Heart attack Mother     deceased  . Heart attack Father     deceased    Social History Social History  Substance Use Topics  . Smoking status: Never Smoker   . Smokeless tobacco: None  . Alcohol Use: 0.0 oz/week    0 Standard drinks or  equivalent per week     Comment: one cocktail every day.    Review of Systems Constitutional: No fever/chills Eyes: No visual changes. ENT: No sore throat. Cardiovascular:  chest pain. Respiratory:  shortness of breath. Gastrointestinal: No abdominal pain.  No nausea, no vomiting.  No diarrhea.  No constipation. Genitourinary: Negative for dysuria. Musculoskeletal: Negative for back pain. Skin: Negative for rash. Neurological: Negative for headaches, focal weakness or numbness.  10-point ROS otherwise negative.  ____________________________________________   PHYSICAL EXAM:  VITAL SIGNS: ED Triage Vitals  Enc Vitals Group     BP 11/07/15 0154 175/95 mmHg     Pulse Rate 11/07/15 0154 78     Resp 11/07/15 0154 21     Temp --      Temp src --      SpO2 11/07/15 0154 99 %     Weight 11/07/15 0154 180 lb (81.647 kg)     Height 11/07/15 0154  (1.778 m)     Head Cir --      Peak Flow --      Pain Score 11/07/15 0155 7     Pain Loc --      Pain Edu? --      Excl. in GC? --     Constitutional: Alert and oriented. Well appearing and in moderate distress. Eyes: Conjunctivae are normal. PERRL. EOMI. Head: Atraumatic. Nose:  No congestion/rhinnorhea. Mouth/Throat: Mucous membranes are moist.  Oropharynx non-erythematous. Cardiovascular: Normal rate, regular rhythm. Grossly normal heart sounds.  Good peripheral circulation. Respiratory: Normal respiratory effort.  No retractions. Lungs CTAB. Gastrointestinal: Soft and nontender. No distention. Positive bowel sounds Musculoskeletal: No lower extremity tenderness nor edema.   Neurologic:  Normal speech and language.  Skin:  Skin is warm, dry and intact.  Psychiatric: Mood and affect are normal.   ____________________________________________   LABS (all labs ordered are listed, but only abnormal results are displayed)  Labs Reviewed  BASIC METABOLIC PANEL - Abnormal; Notable for the following:    Glucose, Bld 164 (*)     All other components within normal limits  TROPONIN I - Abnormal; Notable for the following:    Troponin I 0.10 (*)    All other components within normal limits  CBC   ____________________________________________  EKG  ED ECG REPORT I, Rebecka Apley, the attending physician, personally viewed and interpreted this ECG.   Date: 11/07/2015  EKG Time: 149  Rate: 74  Rhythm: normal sinus rhythm  Axis: normal  Intervals:right bundle branch block  ST&T Change: flipped t wave v2, III  ____________________________________________  RADIOLOGY  Chest x-ray: Mild basilar opacities may reflect mild pneumonia ____________________________________________   PROCEDURES  Procedure(s) performed: None  Critical Care performed: No  ____________________________________________   INITIAL IMPRESSION / ASSESSMENT AND PLAN / ED COURSE  Pertinent labs & imaging results that were available during my care of the patient were reviewed by me and considered in my medical decision making (see chart for details).  This is a 82 year old male who comes into the hospital today with some chest pain. The patient is unsure what causing his symptoms but is concerned and very painful. The patient is continuing to have some pain even after his nitroglycerin. I'll give the patient 2 mg of morphine and reassess the patient.  Was discovered that the patient had an elevated troponin. I would but the patient to the hospitalist service for further evaluation. ____________________________________________   FINAL CLINICAL IMPRESSION(S) / ED DIAGNOSES  Final diagnoses:  ACS (acute coronary syndrome) (HCC)  Ischemic chest pain (HCC)  Community acquired pneumonia      Rebecka Apley, MD 11/07/15 [phone]

## 2015-11-07 NOTE — Progress Notes (Addendum)
[facility] -  at [facility]   PATIENT NAME: Benjamin Parks    MR#:  [id number]  DATE OF BIRTH:  1918/12/23  SUBJECTIVE:   Patient here with chest pain. Patient has no chest pain currently.  REVIEW OF SYSTEMS:    Review of Systems  Constitutional: Negative for fever, chills and malaise/fatigue.  HENT: Negative for sore throat.   Eyes: Negative for blurred vision.  Respiratory: Negative for cough, hemoptysis, shortness of breath and wheezing.   Cardiovascular: Negative for chest pain, palpitations and leg swelling.  Gastrointestinal: Negative for nausea, vomiting, abdominal pain, diarrhea and blood in stool.  Genitourinary: Negative for dysuria.  Musculoskeletal: Negative for back pain.  Neurological: Negative for dizziness, tremors and headaches.  Endo/Heme/Allergies: Does not bruise/bleed easily.    Tolerating Diet: yes      DRUG ALLERGIES:   Allergies  Allergen Reactions  . Azithromycin     VITALS:  Blood pressure 109/63, pulse 64, temperature 97.7 F (36.5 C), temperature source Oral, resp. rate 18, height  (1.778 m), weight 81.285 kg (179 lb 3.2 oz), SpO2 95 %.  PHYSICAL EXAMINATION:   Physical Exam  Constitutional: He is oriented to person, place, and time and well-developed, well-nourished, and in no distress. No distress.  HENT:  Head: Normocephalic.  Eyes: No scleral icterus.  Neck: Normal range of motion. Neck supple. No JVD present. No tracheal deviation present.  Cardiovascular: Normal rate, regular rhythm and normal heart sounds.  Exam reveals no gallop and no friction rub.   No murmur heard. Pulmonary/Chest: Effort normal and breath sounds normal. No respiratory distress. He has no wheezes. He has no rales. He exhibits no tenderness.  Abdominal: Soft. Bowel sounds are normal. He exhibits no distension and no mass. There is no tenderness. There is no rebound and no guarding.  Musculoskeletal: Normal range of  motion. He exhibits no edema.  Neurological: He is alert and oriented to person, place, and time.  Skin: Skin is warm. No rash noted. No erythema.  Psychiatric: Affect and judgment normal.      LABORATORY PANEL:   CBC  Recent Labs Lab 11/07/15 0157  WBC 5.8  HGB 15.5  HCT 46.2  PLT 201   ------------------------------------------------------------------------------------------------------------------  Chemistries   Recent Labs Lab 11/07/15 0157  NA 138  K 3.9  CL 103  CO2 27  GLUCOSE 164*  BUN 16  CREATININE 0.85  CALCIUM 9.1   ------------------------------------------------------------------------------------------------------------------  Cardiac Enzymes  Recent Labs Lab 11/07/15 0157 11/07/15 0626  TROPONINI 0.10* 2.45*   ------------------------------------------------------------------------------------------------------------------  RADIOLOGY:  Dg Chest 2 View  11/07/2015  CLINICAL DATA:  Acute onset of left-sided chest pain and shortness of breath. High blood pressure. Initial encounter. EXAM: CHEST  2 VIEW COMPARISON:  Chest radiograph performed 12/08/2013 FINDINGS: The lungs are well-aerated. Mild bibasilar opacities may reflect mild pneumonia. There is no evidence of pleural effusion or pneumothorax. The heart is normal in size; the mediastinal contour is within normal limits. No acute osseous abnormalities are seen. IMPRESSION: Mild bibasilar opacities may reflect mild pneumonia. Electronically Signed   By: Roanna Raider M.D.   On: 11/07/2015 03:00     ASSESSMENT AND PLAN:   82 year old male with a history of diabetes, hypertension and CVA who presents with non-ST elevation MI.  1. Non-ST elevation MI: Continue full dose Lovenox, aspirin, metoprolol and statin. LDL 100 Follow up on cardiology consultation and echo cardiac rhythm.  2. Community-acquired pneumonia: Continue Levaquin and follow up on blood cultures.  3. Diabetes type 2:  Continue sliding scale insulinCheck hemoglobin A1c.   4. History of CVA without residual deficits: Patient is now on full dose Lovenox.He was taking aggrenox Management plans discussed with the patient and he is in agreement.  CODE STATUS: DNR  TOTAL TIME TAKING CARE OF THIS PATIENT: 30 minutes.     POSSIBLE D/C 2-3 days, DEPENDING ON CLINICAL CONDITION.   Benjamin Parks M.D on 11/07/2015 at 11:28 AM  Between 7am to 6pm - Pager - [phone] After 6pm go to [email or web address] - password [email or web address] [facility]  [facility]  Office  [phone]  CC: Primary care physician; No primary care provider on file.  Note: This dictation was prepared with Dragon dictation along with smaller phrase technology. Any transcriptional errors that result from this process are unintentional.

## 2015-11-07 NOTE — Consult Note (Signed)
[facility] Cardiology Consultation Note  Patient ID: Benjamin Parks, MRN: [id number], DOB/AGE: Sep 28, 1918 82 y.o. Admit date: 11/07/2015   Date of Consult: 11/07/2015 Primary Physician: No primary care provider on file. Primary Cardiologist: Gwen Pounds  Chief Complaint:  Chief Complaint  Patient presents with  . Chest Pain   Reason for Consult: acute non-ST elevation myocardial infarction  HPI: 82 y.o. male with hypertension and hyperlipidemia on a previous appropriate medication management 2 is had new onset of significant chest pain and pressure substernally radiating into his back associated with shortness of breath. The patient has an EKG showing normal sinus rhythm with frequent preventricular contractions and right bundle branch block and an elevated troponin of 2.45 consistent with non-ST elevation myocardial infarction. The patient's blood pressure and cholesterol have been treated with atorvastatin and metoprolol which are appropriate. The patient additionally has had aspirin. The patient was placed on heparin and now has had full relief of chest discomfort and feels much better at this time. We have discussed the possibility of further medication management and also intervention as well and will follow very closely over the next 24 hours  Past Medical History  Diagnosis Date  . Right bundle branch block   . PVC (premature ventricular contraction)   . Diabetes mellitus without complication (HCC)   . Hypertension   . Hyperlipidemia   . Stroke [facility])       Surgical History:  Past Surgical History  Procedure Laterality Date  . None       Home Meds: Prior to Admission medications   Not on File    Inpatient Medications:  . aspirin  324 mg Oral NOW   Or  . aspirin  300 mg Rectal NOW  . [START ON 11/08/2015] aspirin EC  81 mg Oral Daily  . atorvastatin  20 mg Oral q1800  . enoxaparin (LOVENOX) injection  1 mg/kg Subcutaneous Q12H  . insulin aspart  0-5 Units Subcutaneous  QHS  . insulin aspart  0-9 Units Subcutaneous TID WC  . levofloxacin (LEVAQUIN) IV  750 mg Intravenous Q24H  . metoprolol tartrate  25 mg Oral BID   . sodium chloride 50 mL/hr at 11/07/15 4098    Allergies:  Allergies  Allergen Reactions  . Azithromycin     Social History   Social History  . Marital Status: Married    Spouse Name: N/A  . Number of Children: N/A  . Years of Education: N/A   Occupational History  . retired.    Social History Main Topics  . Smoking status: Never Smoker   . Smokeless tobacco: Not on file  . Alcohol Use: 0.0 oz/week    0 Standard drinks or equivalent per week     Comment: one cocktail every day.  . Drug Use: No  . Sexual Activity: No   Other Topics Concern  . Not on file   Social History Narrative  . No narrative on file     Family History  Problem Relation Age of Onset  . Heart attack Mother     deceased  . Heart attack Father     deceased     Review of Systems Positive for chest pain pressure Negative for: General:  chills, fever, night sweats or weight changes.  Cardiovascular: PND orthopnea syncope dizziness  Dermatological skin lesions rashes Respiratory: Cough congestion Urologic: Frequent urination urination at night and hematuria Abdominal: negative for nausea, vomiting, diarrhea, bright red blood per rectum, melena, or hematemesis Neurologic: negative for visual changes,  and/or hearing changes  All other systems reviewed and are otherwise negative except as noted above.  Labs:  Recent Labs  11/07/15 0157 11/07/15 0626 11/07/15 1107  TROPONINI 0.10* 2.45* 7.58*   Lab Results  Component Value Date   WBC 5.8 11/07/2015   HGB 15.5 11/07/2015   HCT 46.2 11/07/2015   MCV 92.2 11/07/2015   PLT 201 11/07/2015    Recent Labs Lab 11/07/15 0157  NA 138  K 3.9  CL 103  CO2 27  BUN 16  CREATININE 0.85  CALCIUM 9.1  GLUCOSE 164*   Lab Results  Component Value Date   CHOL 164 11/07/2015   HDL 44  11/07/2015   LDLCALC 100* 11/07/2015   TRIG 98 11/07/2015   No results found for: DDIMER  Radiology/Studies:  Dg Chest 2 View  11/07/2015  CLINICAL DATA:  Acute onset of left-sided chest pain and shortness of breath. High blood pressure. Initial encounter. EXAM: CHEST  2 VIEW COMPARISON:  Chest radiograph performed 12/08/2013 FINDINGS: The lungs are well-aerated. Mild bibasilar opacities may reflect mild pneumonia. There is no evidence of pleural effusion or pneumothorax. The heart is normal in size; the mediastinal contour is within normal limits. No acute osseous abnormalities are seen. IMPRESSION: Mild bibasilar opacities may reflect mild pneumonia. Electronically Signed   By: Roanna Raider M.D.   On: 11/07/2015 03:00    EKG: Normal sinus rhythm with preventricular contractions and right bundle branch block  Weights: Filed Weights   11/07/15 0154 11/07/15 0544  Weight: 180 lb (81.647 kg) 179 lb 3.2 oz (81.285 kg)     Physical Exam: Blood pressure 109/63, pulse 64, temperature 97.7 F (36.5 C), temperature source Oral, resp. rate 18, height  (1.778 m), weight 179 lb 3.2 oz (81.285 kg), SpO2 95 %. Body mass index is 25.71 kg/(m^2). General: Well developed, well nourished, in no acute distress. Head eyes ears nose throat: Normocephalic, atraumatic, sclera non-icteric, no xanthomas, nares are without discharge. No apparent thyromegaly and/or mass  Lungs: Normal respiratory effort.  no wheezes, no rales, no rhonchi.  Heart: RRR with normal S1 S2. no murmur gallop, no rub, PMI is normal size and placement, carotid upstroke normal without bruit, jugular venous pressure is normal Abdomen: Soft, non-tender, non-distended with normoactive bowel sounds. No hepatomegaly. No rebound/guarding. No obvious abdominal masses. Abdominal aorta is normal size without bruit Extremities: No edema. no cyanosis, no clubbing, no ulcers  Peripheral : 2+ bilateral upper extremity pulses, 2+ bilateral  femoral pulses, 2+ bilateral dorsal pedal pulse Neuro: Alert and oriented. No facial asymmetry. No focal deficit. Moves all extremities spontaneously. Musculoskeletal: Normal muscle tone without kyphosis Psych:  Responds to questions appropriately with a normal affect.    Assessment: 82 year old male with hypertension hyperlipidemia diabetes with a non-ST elevation myocardial infarction  Plan: 1. Continue heparin for 24 more hours 2. Aspirin and possible use of Plavix for further treatment of non-ST elevation myocardial infarction 3. Metoprolol and nitrates for chest pain and myocardial infarction 4. High intensity cholesterol therapy 5. Possible proceeding to cardiac catheterization to assess coronary anatomy and further treatment thereof is necessary depending on above. Patient understands the risk and benefits of cardiac catheterization. This includes a possibility of death stroke heart attack infection bleeding or blood clot. The patient understands he is at low risk for conscious sedation  Signed, Lamar Blinks M.D. [facility] Cardiology 11/07/2015, 1:17 PM

## 2015-11-07 NOTE — Progress Notes (Signed)
MD Gwen Pounds was notified of trop 12.29. No further orders.

## 2015-11-07 NOTE — Progress Notes (Signed)
MD Gwen Pounds states he will treat pt medically no cath today. 2 ga na diet.

## 2015-11-07 NOTE — ED Notes (Signed)
Admitting MD at bedside.

## 2015-11-07 NOTE — H&P (Addendum)
[facility] - [place] at [facility]   PATIENT NAME: Benjamin Parks    MR#:  [id number]  DATE OF BIRTH:  25-Dec-1918  DATE OF ADMISSION:  11/07/2015  PRIMARY CARE PHYSICIAN: No primary care provider on file.   REQUESTING/REFERRING PHYSICIAN:   CHIEF COMPLAINT:   Chief Complaint  Patient presents with  . Chest Pain    HISTORY OF PRESENT ILLNESS: [name]  is a 82 y.o. male with a known history of premature ventricular contractions presented to the emergency room with chest discomfort. Patient went to bed around 12 midnight and right after that he noticed left-sided chest pain. It's more like pressure-like sensation in the left side of the chest was 5 out of 10 on a scale of 1-10. It gets aggravated with deep breathing. No history of any cough or fever. No history of any chills. No history of headache dizziness blurry vision. No history of nausea vomiting or any diaphoresis. No history of any syncope. Patient was worked up in the emergency room and his first set of troponin was elevated. Patient is pretty active and still drives and lives in a retirement facility. Regularly exercises in the aquatic pool in [facility].No complaints of any shortness of breath.No fever or chills.  PAST MEDICAL HISTORY:   Past Medical History  Diagnosis Date  . Right bundle branch block   . PVC (premature ventricular contraction)     PAST SURGICAL HISTORY: Past Surgical History  Procedure Laterality Date  . None      SOCIAL HISTORY:  Social History  Substance Use Topics  . Smoking status: Never Smoker   . Smokeless tobacco: Not on file  . Alcohol Use: 0.0 oz/week    0 Standard drinks or equivalent per week     Comment: one cocktail every day.    FAMILY HISTORY:  Family History  Problem Relation Age of Onset  . Heart attack Mother     deceased  . Heart attack Father     deceased    DRUG ALLERGIES:  Allergies  Allergen Reactions  . Azithromycin      REVIEW OF SYSTEMS:   CONSTITUTIONAL: No fever, fatigue or weakness.  EYES: No blurred or double vision.  EARS, NOSE, AND THROAT: No tinnitus or ear pain.  RESPIRATORY: No cough, shortness of breath, wheezing or hemoptysis.  CARDIOVASCULAR: Has chest pain, no orthopnea, edema.  GASTROINTESTINAL: No nausea, vomiting, diarrhea or abdominal pain.  GENITOURINARY: No dysuria, hematuria.  ENDOCRINE: No polyuria, nocturia,  HEMATOLOGY: No anemia, easy bruising or bleeding SKIN: No rash or lesion. MUSCULOSKELETAL: No joint pain or arthritis.   NEUROLOGIC: No tingling, numbness, weakness.  PSYCHIATRY: No anxiety or depression.   MEDICATIONS AT HOME:  Prior to Admission medications   Not on File      PHYSICAL EXAMINATION:   VITAL SIGNS: Blood pressure 141/90, pulse 74, resp. rate 19, height 5\' 10"  (1.778 m), weight 81.647 kg (180 lb), SpO2 94 %.  GENERAL:  82 y.o.-year-old patient lying in the bed with no acute distress.  EYES: Pupils equal, round, reactive to light and accommodation. No scleral icterus. Extraocular muscles intact.  HEENT: Head atraumatic, normocephalic. Oropharynx and nasopharynx clear.  NECK:  Supple, no jugular venous distention. No thyroid enlargement, no tenderness.  LUNGS: Normal breath sounds bilaterally, no wheezing, rales,rhonchi or crepitation. No use of accessory muscles of respiration.  CARDIOVASCULAR: S1, S2 normal. No murmurs, rubs, or gallops.  ABDOMEN: Soft, nontender, nondistended. Bowel sounds present. No organomegaly or  mass.  EXTREMITIES: No pedal edema, cyanosis, or clubbing.  NEUROLOGIC: Cranial nerves II through XII are intact. Muscle strength 5/5 in all extremities. Sensation intact. Gait normal. PSYCHIATRIC: The patient is alert and oriented x 3.  SKIN: No obvious rash, lesion, or ulcer.   LABORATORY PANEL:   CBC  Recent Labs Lab 11/07/15 0157  WBC 5.8  HGB 15.5  HCT 46.2  PLT 201  MCV 92.2  MCH 30.9  MCHC 33.5  RDW 13.3    ------------------------------------------------------------------------------------------------------------------  Chemistries   Recent Labs Lab 11/07/15 0157  NA 138  K 3.9  CL 103  CO2 27  GLUCOSE 164*  BUN 16  CREATININE 0.85  CALCIUM 9.1   ------------------------------------------------------------------------------------------------------------------ estimated creatinine clearance is 53.7 mL/min (by C-G formula based on Cr of 0.85). ------------------------------------------------------------------------------------------------------------------ No results for input(s): TSH, T4TOTAL, T3FREE, THYROIDAB in the last 72 hours.  Invalid input(s): FREET3   Coagulation profile No results for input(s): INR, PROTIME in the last 168 hours. ------------------------------------------------------------------------------------------------------------------- No results for input(s): DDIMER in the last 72 hours. -------------------------------------------------------------------------------------------------------------------  Cardiac Enzymes  Recent Labs Lab 11/07/15 0157  TROPONINI 0.10*   ------------------------------------------------------------------------------------------------------------------ Invalid input(s): POCBNP  ---------------------------------------------------------------------------------------------------------------  Urinalysis    Component Value Date/Time   COLORURINE Yellow 12/03/2012 1916   APPEARANCEUR Cloudy 12/03/2012 1916   LABSPEC 1.017 12/03/2012 1916   PHURINE 5.0 12/03/2012 1916   GLUCOSEU Negative 12/03/2012 1916   HGBUR 2+ 12/03/2012 1916   BILIRUBINUR Negative 12/03/2012 1916   KETONESUR Trace 12/03/2012 1916   PROTEINUR 30 mg/dL 16/06/9603 5409   NITRITE Positive 12/03/2012 1916   LEUKOCYTESUR 3+ 12/03/2012 1916     RADIOLOGY: Dg Chest 2 View  11/07/2015  CLINICAL DATA:  Acute onset of left-sided chest pain and shortness  of breath. High blood pressure. Initial encounter. EXAM: CHEST  2 VIEW COMPARISON:  Chest radiograph performed 12/08/2013 FINDINGS: The lungs are well-aerated. Mild bibasilar opacities may reflect mild pneumonia. There is no evidence of pleural effusion or pneumothorax. The heart is normal in size; the mediastinal contour is within normal limits. No acute osseous abnormalities are seen. IMPRESSION: Mild bibasilar opacities may reflect mild pneumonia. Electronically Signed   By: Roanna Raider M.D.   On: 11/07/2015 03:00    EKG: Orders placed or performed during the hospital encounter of 11/07/15  . ED EKG within 10 minutes  . ED EKG within 10 minutes  . EKG 12-Lead  . EKG 12-Lead    IMPRESSION AND PLAN: 82 year old elderly male patient with history of premature ventricle contractions presented to the emergency room for chest pain. Workup showed elevated troponin.  Admitting diagnosis: 1 unstable angina 2. Non-STEMI 3. Community-acquired pneumonia Treatment plan Admit patient to telemetry Cardiac consultation Start patient on aspirin 81 mg daily Nitrates for chest pain along with when necessary morphine Echocardiogram to assess LV function Levaquin antibiotic for pneumonia Anticoagulate patient with full dose Lovenox subcutaneously Trend troponin. Supportive care.  All the records are reviewed and case discussed with ED provider. Management plans discussed with the patient, family and they are in agreement.  CODE STATUS:DNR Code Status History    This patient does not have a recorded code status. Please follow your organizational policy for patients in this situation.       TOTAL TIME TAKING CARE OF THIS PATIENT: 51 minutes.    Ihor Austin M.D on 11/07/2015 at 3:51 AM  Between 7am to 6pm - Pager - [phone]  After 6pm go to [email or web address] - password [email or web address] [facility] Hospitalists  Office  [phone]  CC: Primary care physician; No primary care provider  on file.

## 2015-11-07 NOTE — Care Management Obs Status (Signed)
MEDICARE OBSERVATION STATUS NOTIFICATION   Patient Details  Name: Benjamin Parks MRN: [id number] Date of Birth: 11-26-1918   Medicare Observation Status Notification Given:  Yes    CrutchfieldDerrill Memo, RN 11/07/2015, 2:55 PM

## 2015-11-07 NOTE — Progress Notes (Signed)
Pharmacy Antibiotic Note  Benjamin Parks is a 82 y.o. male admitted on 11/07/2015 with pneumonia.  Pharmacy has been consulted for Levaquin dosing.  Plan: Levaquin 750 mg IV q 24 hours ordered.  Height:  (177.8 cm) Weight: 180 lb (81.647 kg) IBW/kg (Calculated) : 73  No data recorded.   Recent Labs Lab 11/07/15 0157  WBC 5.8  CREATININE 0.85    Estimated Creatinine Clearance: 53.7 mL/min (by C-G formula based on Cr of 0.85).    Allergies  Allergen Reactions  . Azithromycin     Antimicrobials this admission:   Dose adjustments this admission:   Microbiology results:  BCx:   UCx:    Sputum:    MRSA PCR:   CXR: bibasilar opacities  Thank you for allowing pharmacy to be a part of this patient's care.  Hailynn Slovacek S 11/07/2015 5:10 AM

## 2015-11-07 NOTE — ED Notes (Signed)
Pt arrived via EMS from [facility]. Pt reports that while lying in bed 1 hour ago pt had sudden onset of left sided chest pain and SOB. Pt is hypertensive with EMS (190/95) with no hx of HTN. EMS administered 324 ASA and applied 2 in Nitro paste. Pt reports no relief after nitro. Pt has hx of RBBB and PVCs in EKG. Pt alert and oriented x 4 upon arrival and no longer reporting SOB.

## 2015-11-07 NOTE — ED Notes (Signed)
0.10 troponin called critical , MD notified

## 2015-11-07 NOTE — Progress Notes (Signed)
A & O. Room air. Complained of pain and received meds. FS are stable. Cardio to tx medically. Son at the bedside. Med rec updated. IV abx for PNA. Pt has no further concerns at this time.

## 2015-11-07 NOTE — Progress Notes (Addendum)
ANTICOAGULATION CONSULT NOTE - Initial Consult  Pharmacy Consult for Lovenox dosing Indication: ACS/NSTEMI/CP  Allergies  Allergen Reactions  . Azithromycin     Patient Measurements: Height:  (177.8 cm) Weight: 180 lb (81.647 kg) IBW/kg (Calculated) : 73 Heparin Dosing Weight: 81.6kg  Vital Signs: BP: 111/68 mmHg (02/22 0500) Pulse Rate: 76 (02/22 0500)  Labs:  Recent Labs  11/07/15 0157  HGB 15.5  HCT 46.2  PLT 201  CREATININE 0.85  TROPONINI 0.10*    Estimated Creatinine Clearance: 53.7 mL/min (by C-G formula based on Cr of 0.85).   Medical History: Past Medical History  Diagnosis Date  . Right bundle branch block   . PVC (premature ventricular contraction)   . Diabetes mellitus without complication (HCC)   . Hypertension   . Hyperlipidemia   . Stroke [facility])     Medications:    Assessment: Hgb 15.5  plt 201  Goal of Therapy:   Monitor platelets by anticoagulation protocol: Yes   Plan:  Lovenox 80 mg q 12 hours ordered.   Shanyah Gattuso S 11/07/2015,5:34 AM

## 2015-11-07 NOTE — Care Management (Addendum)
Patient from [facility]. CSW aware with RNCM following. Troponin's positive. NSTEMI.  Cardiology consult pending. PNA, IV antibiotics.

## 2015-11-08 ENCOUNTER — Encounter
Admission: EM | Disposition: A | Discharge status: Home / Self Care | Payer: Self-pay | Source: Home / Self Care | Attending: Internal Medicine

## 2015-11-08 HISTORY — PX: CARDIAC CATHETERIZATION: SHX172

## 2015-11-08 LAB — GLUCOSE, CAPILLARY
Glucose-Capillary: 154 mg/dL — ABNORMAL HIGH (ref 65–99)
Glucose-Capillary: 214 mg/dL — ABNORMAL HIGH (ref 65–99)
Glucose-Capillary: 111 mg/dL — ABNORMAL HIGH (ref 65–99)
Glucose-Capillary: 176 mg/dL — ABNORMAL HIGH (ref 65–99)

## 2015-11-08 LAB — BASIC METABOLIC PANEL
ANION GAP: 7 (ref 5–15)
BUN: 16 mg/dL (ref 6–20)
CHLORIDE: 105 mmol/L (ref 101–111)
CO2: 28 mmol/L (ref 22–32)
Calcium: 8.8 mg/dL — ABNORMAL LOW (ref 8.9–10.3)
Creatinine, Ser: 0.9 mg/dL (ref 0.61–1.24)
GFR calc non Af Amer: >60 mL/min (ref >60)
GLUCOSE: 166 mg/dL — AB (ref 65–99)
Potassium: 4.8 mmol/L (ref 3.5–5.1)
Sodium: 140 mmol/L (ref 135–145)

## 2015-11-08 SURGERY — LEFT HEART CATH AND CORONARY ANGIOGRAPHY
Anesthesia: Moderate Sedation

## 2015-11-08 MED ORDER — MIDAZOLAM HCL 2 MG/2ML IJ SOLN
INTRAMUSCULAR | Status: AC
  Filled 2015-11-08: qty 2

## 2015-11-08 MED ORDER — SODIUM CHLORIDE 0.9 % IV SOLN: 250.0000 mL | INTRAVENOUS | Status: DC | PRN

## 2015-11-08 MED ORDER — IOHEXOL 300 MG/ML  SOLN
INTRAMUSCULAR | Status: DC | PRN
  Administered 2015-11-08: 110 mL via INTRA_ARTERIAL

## 2015-11-08 MED ORDER — SODIUM CHLORIDE 0.9% FLUSH
3.0000 mL | Freq: Two times a day (BID) | INTRAVENOUS | Status: DC
  Administered 2015-11-08: 3 mL via INTRAVENOUS

## 2015-11-08 MED ORDER — ASPIRIN 81 MG PO CHEW: 81.0000 mg | CHEWABLE_TABLET | ORAL | Status: DC

## 2015-11-08 MED ORDER — LISINOPRIL 5 MG PO TABS
2.5000 mg | ORAL_TABLET | Freq: Every day | ORAL | Status: DC
  Administered 2015-11-08 – 2015-11-09 (×2): 2.5 mg via ORAL
  Filled 2015-11-08 (×3): qty 1

## 2015-11-08 MED ORDER — SODIUM CHLORIDE 0.9 % WEIGHT BASED INFUSION: 3.0000 mL/kg/h | INTRAVENOUS | Status: DC

## 2015-11-08 MED ORDER — FENTANYL CITRATE (PF) 100 MCG/2ML IJ SOLN
INTRAMUSCULAR | Status: DC | PRN
  Administered 2015-11-08: 25 ug via INTRAVENOUS

## 2015-11-08 MED ORDER — LEVOFLOXACIN IN D5W 500 MG/100ML IV SOLN
500.0000 mg | INTRAVENOUS | Status: DC
  Administered 2015-11-08: 500 mg via INTRAVENOUS
  Filled 2015-11-08 (×2): qty 100

## 2015-11-08 MED ORDER — FENTANYL CITRATE (PF) 100 MCG/2ML IJ SOLN
INTRAMUSCULAR | Status: AC
Start: 2015-11-08 — End: 2015-11-08
  Filled 2015-11-08: qty 2

## 2015-11-08 MED ORDER — HEPARIN (PORCINE) IN NACL 2-0.9 UNIT/ML-% IJ SOLN
INTRAMUSCULAR | Status: AC
  Filled 2015-11-08: qty 500

## 2015-11-08 MED ORDER — SODIUM CHLORIDE 0.9% FLUSH
3.0000 mL | INTRAVENOUS | Status: DC | PRN
Start: 2015-11-08 — End: 2015-11-08

## 2015-11-08 MED ORDER — MIDAZOLAM HCL 2 MG/2ML IJ SOLN
INTRAMUSCULAR | Status: DC | PRN
  Administered 2015-11-08: 0.5 mg via INTRAVENOUS

## 2015-11-08 MED ORDER — SODIUM CHLORIDE 0.9 % WEIGHT BASED INFUSION: 1.0000 mL/kg/h | INTRAVENOUS | Status: DC

## 2015-11-08 MED ORDER — CLOPIDOGREL BISULFATE 75 MG PO TABS
75.0000 mg | ORAL_TABLET | Freq: Every day | ORAL | Status: DC
  Administered 2015-11-08 – 2015-11-09 (×2): 75 mg via ORAL
  Filled 2015-11-08 (×2): qty 1

## 2015-11-08 MED ORDER — ATORVASTATIN CALCIUM 20 MG PO TABS
80.0000 mg | ORAL_TABLET | Freq: Every day | ORAL | Status: DC
  Administered 2015-11-08: 80 mg via ORAL
  Filled 2015-11-08: qty 1
  Filled 2015-11-08: qty 4

## 2015-11-08 SURGICAL SUPPLY — 9 items
CATH INFINITI 5FR ANG PIGTAIL (CATHETERS) ×3 IMPLANT
CATH INFINITI 5FR JL4 (CATHETERS) ×3 IMPLANT
CATH INFINITI JR4 5F (CATHETERS) ×3 IMPLANT
DEVICE CLOSURE MYNXGRIP 5F (Vascular Products) ×3 IMPLANT
KIT MANI 3VAL PERCEP (MISCELLANEOUS) ×3 IMPLANT
NEEDLE PERC 18GX7CM (NEEDLE) ×3 IMPLANT
PACK CARDIAC CATH (CUSTOM PROCEDURE TRAY) ×3 IMPLANT
SHEATH PINNACLE 5F 10CM (SHEATH) ×3 IMPLANT
WIRE EMERALD 3MM-J .035X150CM (WIRE) ×3 IMPLANT

## 2015-11-08 NOTE — Progress Notes (Signed)
BP 102/61, HR 59.  Benjamin Parks said to hold metoprolol this AM.

## 2015-11-08 NOTE — Progress Notes (Signed)
Pharmacy Antibiotic Note  Benjamin Parks is a 82 y.o. male admitted on 11/07/2015 with pneumonia.  Pharmacy has been consulted for Levaquin dosing.  Plan: Levaquin 750 mg IV x 1 was given 2/22. Patient is 82 yo with borderline CrCl at  8ml/min. Dose will be be adjusted to levofloxacin  daily.   Height:  (177.8 cm) Weight: 179 lb 12.8 oz (81.557 kg) IBW/kg (Calculated) : 73  Temp (24hrs), Avg:97.9 F (36.6 C), Min:97.6 F (36.4 C), Max:98.5 F (36.9 C)   Recent Labs Lab 11/07/15 0157 11/08/15 0427  WBC 5.8  --   CREATININE 0.85 0.90    Estimated Creatinine Clearance: 50.7 mL/min (by C-G formula based on Cr of 0.9).    Allergies  Allergen Reactions  . Azithromycin     Antimicrobials this admission:   Dose adjustments this admission:   Microbiology results: Sputum: N/A  MRSA PCR: negative   CXR: bibasilar opacities, possible mild PNA  Thank you for allowing pharmacy to be a part of this patient's care.  Cher Nakai, PharmD Pharmacy Resident 11/08/2015 8:36 AM

## 2015-11-08 NOTE — Progress Notes (Signed)
Mody called, asking if cath plans were scheduled.  Paged [email or web address]. He says he needs to speak to patient but has tentative plans for cath later this afternoon.  Patient able to eat breakfast but will be NPO after breakfast today.

## 2015-11-08 NOTE — Progress Notes (Signed)
[facility] Physicians - [place] at [facility]   PATIENT NAME: Benjamin Parks    MR#:  [id number]  DATE OF BIRTH:  July 12, 1919  SUBJECTIVE:  Patient eating breakfast. Denies chest pressure suppressed. He is going for cardiac catheterization later this afternoon.  REVIEW OF SYSTEMS:    Review of Systems  Constitutional: Negative for fever, chills and malaise/fatigue.  HENT: Negative for sore throat.   Eyes: Negative for blurred vision.  Respiratory: Negative for cough, hemoptysis, shortness of breath and wheezing.   Cardiovascular: Negative for chest pain, palpitations and leg swelling.  Gastrointestinal: Negative for nausea, vomiting, abdominal pain, diarrhea and blood in stool.  Genitourinary: Negative for dysuria.  Musculoskeletal: Negative for back pain.  Neurological: Negative for dizziness, tremors and headaches.  Endo/Heme/Allergies: Does not bruise/bleed easily.    Tolerating Diet: yes      DRUG ALLERGIES:   Allergies  Allergen Reactions  . Azithromycin     VITALS:  Blood pressure 102/61, pulse 59, temperature 98.5 F (36.9 C), temperature source Oral, resp. rate 18, height  (1.778 m), weight 81.557 kg (179 lb 12.8 oz), SpO2 94 %.  PHYSICAL EXAMINATION:   Physical Exam  Constitutional: He is oriented to person, place, and time and well-developed, well-nourished, and in no distress. No distress.  HENT:  Head: Normocephalic.  Eyes: No scleral icterus.  Neck: Normal range of motion. Neck supple. No JVD present. No tracheal deviation present.  Cardiovascular: Normal rate, regular rhythm and normal heart sounds.  Exam reveals no gallop and no friction rub.   No murmur heard. Pulmonary/Chest: Effort normal and breath sounds normal. No respiratory distress. He has no wheezes. He has no rales. He exhibits no tenderness.  Abdominal: Soft. Bowel sounds are normal. He exhibits no distension and no mass. There is no tenderness. There is no rebound  and no guarding.  Musculoskeletal: Normal range of motion. He exhibits no edema.  Neurological: He is alert and oriented to person, place, and time.  Skin: Skin is warm. No rash noted. No erythema.  Psychiatric: Affect and judgment normal.      LABORATORY PANEL:   CBC  Recent Labs Lab 11/07/15 0157  WBC 5.8  HGB 15.5  HCT 46.2  PLT 201   ------------------------------------------------------------------------------------------------------------------  Chemistries   Recent Labs Lab 11/08/15 0427  NA 140  K 4.8  CL 105  CO2 28  GLUCOSE 166*  BUN 16  CREATININE 0.90  CALCIUM 8.8*   ------------------------------------------------------------------------------------------------------------------  Cardiac Enzymes  Recent Labs Lab 11/07/15 0626 11/07/15 1107 11/07/15 1712  TROPONINI 2.45* 7.58* 12.29*   ------------------------------------------------------------------------------------------------------------------  RADIOLOGY:  Dg Chest 2 View  11/07/2015  CLINICAL DATA:  Acute onset of left-sided chest pain and shortness of breath. High blood pressure. Initial encounter. EXAM: CHEST  2 VIEW COMPARISON:  Chest radiograph performed 12/08/2013 FINDINGS: The lungs are well-aerated. Mild bibasilar opacities may reflect mild pneumonia. There is no evidence of pleural effusion or pneumothorax. The heart is normal in size; the mediastinal contour is within normal limits. No acute osseous abnormalities are seen. IMPRESSION: Mild bibasilar opacities may reflect mild pneumonia. Electronically Signed   By: Roanna Raider M.D.   On: 11/07/2015 03:00     ASSESSMENT AND PLAN:   82 year old male with a history of diabetes, hypertension and CVA who presents with non-ST elevation MI.  1. Non-ST elevation MI: Troponin max 12.12 9  Continue full dose Lovenox, aspirin, metoprolol and statin. LDL 100 Plan for cardiac catheterization today.   2. Community-acquired  pneumonia:  Continue Levaquin and blood cultures are negative.  3. Diabetes type 2: Continue sliding scale insulin  hemoglobin A1c is 7.2. Consult diabetes coordinator  4. History of CVA without residual deficits: Patient is now on full dose Lovenox. He was taking aggrenox   Management plans discussed with the patient and he is in agreement.  CODE STATUS: DNR  TOTAL TIME TAKING CARE OF THIS PATIENT: 30 minutes.     POSSIBLE D/C 1-2 days DEPENDING ON Cardiac cath  Akayla Brass M.D on 11/08/2015 at 10:36 AM  Between 7am to 6pm - Pager - [phone] After 6pm go to [email or web address] - password [email or web address] [facility]  [place] [facility] Hospitalists  Office  [phone]  CC: Primary care physician; No primary care provider on file.  Note: This dictation was prepared with Dragon dictation along with smaller phrase technology. Any transcriptional errors that result from this process are unintentional.

## 2015-11-08 NOTE — CV Procedure (Signed)
Patient underwent left cardiac catheterization this afternoon. This revealed moderate to severely reduced LV function with apical and anteroapical akinesis. The left main was unremarkable. The LAD had a proximal 60-70% stenosis as well as a mid stenosis but vessel. Left circumflex showed evidence of 70% bifurcating proximal circumflex lesion with 80% OM1. The RCA was unremarkable. After discussion, plan was to attempt medical therapy. If he fails this, consideration for Iris PCI of the LAD could be carried out. Will continue with 81 mg aspirin, 75 mg Plavix, lisinopril 2.5 mg daily, metoprolol 25 mg twice daily and atorvastatin 80 mg daily.

## 2015-11-08 NOTE — Progress Notes (Signed)
Inpatient Diabetes Program Recommendations  AACE/ADA: New Consensus Statement on Inpatient Glycemic Control (2015)  Target Ranges:  Prepandial:   less than 140 mg/dL      Peak postprandial:   less than 180 mg/dL (1-2 hours)      Critically ill patients:  140 - 180 mg/dL   Review of Glycemic Control  Results for EMRYS, MCKAMIE (MRN [id number]) as of 11/08/2015 11:02  Ref. Range 11/07/2015 12:05 11/07/2015 16:19 11/07/2015 21:30 11/08/2015 07:32  Glucose-Capillary Latest Ref Range: 65-99 mg/dL 657 (H) 846 (H) 962 (H) 154 (H)    Diabetes history: Type 2- A1C 7.2% Outpatient Diabetes medications: none Current orders for Inpatient glycemic control: Novolog 0-9 units tid and Novolog 0-5 units qhs  Inpatient Diabetes Program Recommendations:  Since patient is NPO, consider changing Novolog correction to 0-9 units q6h  Susette Racer, RN, [place], [place], CDE Diabetes Coordinator Inpatient Diabetes Program  [phone] (Team Pager) [phone] [facility] Office) 11/08/2015 11:04 AM

## 2015-11-09 ENCOUNTER — Encounter: Payer: Self-pay | Admitting: Cardiology

## 2015-11-09 LAB — BASIC METABOLIC PANEL
ANION GAP: 7 (ref 5–15)
BUN: 13 mg/dL (ref 6–20)
CO2: 27 mmol/L (ref 22–32)
Calcium: 8.4 mg/dL — ABNORMAL LOW (ref 8.9–10.3)
Chloride: 106 mmol/L (ref 101–111)
Creatinine, Ser: 0.86 mg/dL (ref 0.61–1.24)
GFR calc Af Amer: >60 mL/min (ref >60)
GFR calc non Af Amer: >60 mL/min (ref >60)
GLUCOSE: 149 mg/dL — AB (ref 65–99)
POTASSIUM: 4.4 mmol/L (ref 3.5–5.1)
Sodium: 140 mmol/L (ref 135–145)

## 2015-11-09 LAB — GLUCOSE, CAPILLARY: GLUCOSE-CAPILLARY: 168 mg/dL — AB (ref 65–99)

## 2015-11-09 MED ORDER — LEVOFLOXACIN 500 MG PO TABS: 500.0000 mg | ORAL_TABLET | Freq: Every day | ORAL | Status: DC

## 2015-11-09 MED ORDER — ATORVASTATIN CALCIUM 80 MG PO TABS: 40.0000 mg | ORAL_TABLET | Freq: Every day | ORAL | Status: DC

## 2015-11-09 MED ORDER — ACCU-CHEK MULTICLIX LANCETS MISC: Status: DC

## 2015-11-09 MED ORDER — METOPROLOL TARTRATE 25 MG PO TABS: 25.0000 mg | ORAL_TABLET | Freq: Two times a day (BID) | ORAL | Status: DC

## 2015-11-09 MED ORDER — GLIPIZIDE 5 MG PO TABS: 2.5000 mg | ORAL_TABLET | Freq: Every day | ORAL | Status: DC

## 2015-11-09 MED ORDER — CLOPIDOGREL BISULFATE 75 MG PO TABS: 75.0000 mg | ORAL_TABLET | Freq: Every day | ORAL | Status: DC

## 2015-11-09 MED ORDER — ASPIRIN 81 MG PO TBEC: 81.0000 mg | DELAYED_RELEASE_TABLET | Freq: Every day | ORAL | Status: DC

## 2015-11-09 MED ORDER — NITROGLYCERIN 0.4 MG SL SUBL: 0.4000 mg | SUBLINGUAL_TABLET | SUBLINGUAL | Status: DC | PRN

## 2015-11-09 MED ORDER — LISINOPRIL 2.5 MG PO TABS: 2.5000 mg | ORAL_TABLET | Freq: Every day | ORAL | Status: DC

## 2015-11-09 NOTE — Progress Notes (Signed)
Patient with c/o SOB. No signs of respiratory distress at this time. Mild facial grimacing. Patient describes SOB as a (L) chest pain with deep inhalation and unable to get a full, complete, deep breath. RA sats 90%. Oxygen applied, via [place] at 2L with sats increasing to 95%. Other VS obtained: 142/70 HR 64 RR 12

## 2015-11-09 NOTE — Care Management (Addendum)
Verbally informed that patient is interested in "going to the other side at [facility] for rehab."   he is medically stable for discharge today.  PT consult pending

## 2015-11-09 NOTE — Progress Notes (Signed)
[facility] Cardiology [facility] Encounter Note  Patient: Benjamin Parks / Admit Date: 11/07/2015 / Date of Encounter: 11/09/2015, 7:50 AM   Subjective: No more chest pain. Patient is hemodynamically stable. Patient had cardiac catheterization showing no evidence of critical coronary artery disease requiring further intervention  Review of Systems: Positive for: Weakness Negative for: Vision change, hearing change, syncope, dizziness, nausea, vomiting,diarrhea, bloody stool, stomach pain, cough, congestion, diaphoresis, urinary frequency, urinary pain,skin lesions, skin rashes Others previously listed  Objective: Telemetry: Normal sinus rhythm Physical Exam: Blood pressure 132/66, pulse 65, temperature 97.9 F (36.6 C), temperature source Oral, resp. rate 18, height  (1.778 m), weight 179 lb 12.8 oz (81.557 kg), SpO2 95 %. Body mass index is 25.8 kg/(m^2). General: Well developed, well nourished, in no acute distress. Head: Normocephalic, atraumatic, sclera non-icteric, no xanthomas, nares are without discharge. Neck: No apparent masses Lungs: Normal respirations with no wheezes, few rhonchi, no rales , no crackles   Heart: Regular rate and rhythm, normal S1 S2, no murmur, no rub, no gallop, PMI is normal size and placement, carotid upstroke normal without bruit, jugular venous pressure normal Abdomen: Soft, non-tender, non-distended with normoactive bowel sounds. No hepatosplenomegaly. Abdominal aorta is normal size without bruit Extremities: Trace edema, no clubbing, no cyanosis, no ulcers,  Peripheral: 2+ radial, 2+ femoral, 2+ dorsal pedal pulses Neuro: Alert and oriented. Moves all extremities spontaneously. Psych:  Responds to questions appropriately with a normal affect.   Intake/Output Summary (Last 24 hours) at 11/09/15 0750 Last data filed at 11/09/15 0000  Gross per 24 hour  Intake      0 ml  Output    450 ml  Net   -450 ml    Inpatient Medications:  . aspirin  EC  81 mg Oral Daily  . atorvastatin  80 mg Oral q1800  . clopidogrel  75 mg Oral Daily  . enoxaparin (LOVENOX) injection  1 mg/kg Subcutaneous Q12H  . insulin aspart  0-5 Units Subcutaneous QHS  . insulin aspart  0-9 Units Subcutaneous TID WC  . levofloxacin (LEVAQUIN) IV  500 mg Intravenous Q24H  . lisinopril  2.5 mg Oral Daily  . metoprolol tartrate  25 mg Oral BID   Infusions:  . sodium chloride 50 mL/hr at 11/08/15 2025    Labs:  Recent Labs  11/07/15 0157 11/08/15 0427  NA 138 140  K 3.9 4.8  CL 103 105  CO2 27 28  GLUCOSE 164* 166*  BUN 16 16  CREATININE 0.85 0.90  CALCIUM 9.1 8.8*   No results for input(s): AST, ALT, ALKPHOS, BILITOT, PROT, ALBUMIN in the last 72 hours.  Recent Labs  11/07/15 0157  WBC 5.8  HGB 15.5  HCT 46.2  MCV 92.2  PLT 201    Recent Labs  11/07/15 0157 11/07/15 0626 11/07/15 1107 11/07/15 1712  TROPONINI 0.10* 2.45* 7.58* 12.29*   Invalid input(s): POCBNP  Recent Labs  11/07/15 0626  HGBA1C 7.2*     Weights: Filed Weights   11/07/15 0154 11/07/15 0544 11/08/15 0546  Weight: 180 lb (81.647 kg) 179 lb 3.2 oz (81.285 kg) 179 lb 12.8 oz (81.557 kg)     Radiology/Studies:  Dg Chest 2 View  11/07/2015  CLINICAL DATA:  Acute onset of left-sided chest pain and shortness of breath. High blood pressure. Initial encounter. EXAM: CHEST  2 VIEW COMPARISON:  Chest radiograph performed 12/08/2013 FINDINGS: The lungs are well-aerated. Mild bibasilar opacities may reflect mild pneumonia. There is no evidence of pleural effusion  or pneumothorax. The heart is normal in size; the mediastinal contour is within normal limits. No acute osseous abnormalities are seen. IMPRESSION: Mild bibasilar opacities may reflect mild pneumonia. Electronically Signed   By: Roanna Raider M.D.   On: 11/07/2015 03:00     Assessment and Recommendation  82 y.o. male with previous history of essential hypertension with a non-ST elevation myocardial  infarction Cardiac catheterization showing diffuse minimal 3 vessel disease with coronary artery disease that is significant in a diagonal artery likely the culprit artery not requiring further intervention and/or stenting at this time. Patient will benefit from medication management 1. Continue dual antiplatelets therapy including Plavix and aspirin due to non-ST elevation myocardial infarction 2. Metoprolol and ACE inhibitor for myocardial infarction as long as patient tolerates with no evidence of hypotension 3. High intensity cholesterol therapy with atorvastatin 4. Begin ambulation today and follow for improvements of symptoms and possible discharge to home and/or outside facility for rehabilitation able. 5. Patient will undergone cardiac rehabilitation for which was discussed and recommended today 6. Follow-up cardiology in one to 2 weeks  Signed, Arnoldo Hooker M.D. FACC

## 2015-11-09 NOTE — Progress Notes (Signed)
Patient verbalized improvement in "SOB" and chest discomfort with deep inhalation. Daily dose of ASA administered during this event as well. MD notified of patient's complaints, interventions provided and cath results (moderate blockages X2 with plan of medical management). MD order obtained for EKG. Will continue to monitor.

## 2015-11-09 NOTE — Discharge Summary (Addendum)
[facility] Physicians - [place] [facility]   PATIENT NAME: Benjamin Parks    MR#:  [id number]  DATE OF BIRTH:  [birth year]  DATE OF ADMISSION:  11/07/2015 ADMITTING PHYSICIAN: Ihor Austin, MD  DATE OF DISCHARGE: *11/09/2015 PRIMARY CARE PHYSICIAN: Jerl Mina  ADMISSION DIAGNOSIS:  Community acquired pneumonia [J18.9] ACS (acute coronary syndrome) (HCC) [I24.9] Ischemic chest pain (HCC) [I20.9]  DISCHARGE DIAGNOSIS:  Principal Problem:   Non-STEMI (non-ST elevated myocardial infarction) [facility]) Active Problems:   Community acquired pneumonia   NSTEMI (non-ST elevated myocardial infarction) (HCC)   SECONDARY DIAGNOSIS:   Past Medical History  Diagnosis Date  . Right bundle branch block   . PVC (premature ventricular contraction)   . Diabetes mellitus without complication (HCC)   . Hypertension   . Hyperlipidemia   . Stroke [facility])     HOSPITAL COURSE:   82 year old male with a history of diabetes, hypertension and CVA who presents with non-ST elevation MI.  1. Non-ST elevation MI: Troponin max 12.12 9  He was initiated on full dose Lovenox, aspirin, metoprolol and statin. LDL 100 He underwent cardiac cath on 2/23. This revealed moderate to severely reduced LV function with apical and anteroapical akinesis. The left main was unremarkable. The LAD had a proximal 60-70% stenosis as well as a mid stenosis but vessel. Left circumflex showed evidence of 70% bifurcating proximal circumflex lesion with 80% OM1. The RCA was unremarkable. After discussion, plan was to attempt medical therapy. If he fails this, consideration for  PCI of the LAD could be carried out   2. Community-acquired pneumonia: He will continue Levaquin and blood cultures are negative.  3. Diabetes type 2: Hemoglobin A1c is 7.2. He will need outpatient follow up and was discharged with glucometer and lancets to check blood sugar qac adn qhs. He would benefit from low dose GLIPIZIDE.  4.  History of CVA without residual deficits: He was taking aggrenox. He is now on Plavix and ASA for CAD and CVA   DISCHARGE CONDITIONS AND DIET:   Stable Cardiac and diabetic diet  CONSULTS OBTAINED:  Treatment Team:  Lamar Blinks, MD Dalia Heading, MD  DRUG ALLERGIES:   Allergies  Allergen Reactions  . Azithromycin     DISCHARGE MEDICATIONS:   Current Discharge Medication List    START taking these medications   Details  aspirin EC 81 MG EC tablet Take 1 tablet (81 mg total) by mouth daily. Qty: 30 tablet, Refills: 0    atorvastatin (LIPITOR) 80 MG tablet Take 0.5 tablets (40 mg total) by mouth daily at 6 PM. Qty: 30 tablet, Refills: 0    clopidogrel (PLAVIX) 75 MG tablet Take 1 tablet (75 mg total) by mouth daily. Qty: 30 tablet, Refills: 0    glipiZIDE (GLUCOTROL) 5 MG tablet Take 0.5 tablets (2.5 mg total) by mouth daily before breakfast. Qty: 30 tablet, Refills: 0    Lancets (ACCU-CHEK MULTICLIX) lancets Use as instructed Qty: 100 each, Refills: 12    levofloxacin (LEVAQUIN) 500 MG tablet Take 1 tablet (500 mg total) by mouth daily. Qty: 3 tablet, Refills: 0    lisinopril (PRINIVIL,ZESTRIL) 2.5 MG tablet Take 1 tablet (2.5 mg total) by mouth daily. Qty: 30 tablet, Refills: 0    metoprolol tartrate (LOPRESSOR) 25 MG tablet Take 1 tablet (25 mg total) by mouth 2 (two) times daily. Qty: 30 tablet, Refills: 0    nitroGLYCERIN (NITROSTAT) 0.4 MG SL tablet Place 1 tablet (0.4 mg total) under the tongue every  5 (five) minutes x 3 doses as needed for chest pain. Qty: 30 tablet, Refills: 0      CONTINUE these medications which have NOT CHANGED   Details  citalopram (CELEXA) 20 MG tablet Take 20 mg by mouth daily.    selenium 50 MCG TABS tablet Take 200 mcg by mouth daily.      STOP taking these medications     dipyridamole-aspirin (AGGRENOX) 200-25 MG 12hr capsule      ezetimibe (ZETIA) 10 MG tablet               Today   CHIEF COMPLAINT:   Patient had episode of SOB last night, but improved now eatingbreakfast no CP   VITAL SIGNS:  Blood pressure 132/66, pulse 65, temperature 97.9 F (36.6 C), temperature source Oral, resp. rate 18, height  (1.778 m), weight 81.557 kg (179 lb 12.8 oz), SpO2 95 %.   REVIEW OF SYSTEMS:  Review of Systems  Constitutional: Negative for fever, chills and malaise/fatigue.  HENT: Negative for sore throat.   Eyes: Negative for blurred vision.  Respiratory: Negative for cough, hemoptysis, shortness of breath and wheezing.   Cardiovascular: Negative for chest pain, palpitations and leg swelling.  Gastrointestinal: Negative for nausea, vomiting, abdominal pain, diarrhea and blood in stool.  Genitourinary: Negative for dysuria.  Musculoskeletal: Negative for back pain.  Neurological: Negative for dizziness, tremors and headaches.  Endo/Heme/Allergies: Does not bruise/bleed easily.     PHYSICAL EXAMINATION:  GENERAL:  82 y.o.-year-old patient lying in the bed with no acute distress.  NECK:  Supple, no jugular venous distention. No thyroid enlargement, no tenderness.  LUNGS: Normal breath sounds bilaterally, no wheezing, rales,rhonchi  No use of accessory muscles of respiration.  CARDIOVASCULAR: S1, S2 normal. No murmurs, rubs, or gallops.  ABDOMEN: Soft, non-tender, non-distended. Bowel sounds present. No organomegaly or mass.  EXTREMITIES: No pedal edema, cyanosis, or clubbing.  PSYCHIATRIC: The patient is alert and oriented x 3.  SKIN: No obvious rash, lesion, or ulcer.   DATA REVIEW:   CBC  Recent Labs Lab 11/07/15 0157  WBC 5.8  HGB 15.5  HCT 46.2  PLT 201    Chemistries   Recent Labs Lab 11/08/15 0427  NA 140  K 4.8  CL 105  CO2 28  GLUCOSE 166*  BUN 16  CREATININE 0.90  CALCIUM 8.8*    Cardiac Enzymes  Recent Labs Lab 11/07/15 0626 11/07/15 1107 11/07/15 1712  TROPONINI 2.45* 7.58* 12.29*    Microbiology Results  @  RADIOLOGY:  No  results found.    Management plans discussed with the patient and he is in agreement. Stable for discharge   Patient should follow up with  Dr Dwaine Deter 1 week  CODE STATUS:     Code Status Orders        Start     Ordered   11/07/15 0625  Do not attempt resuscitation (DNR)   Continuous    Question Answer Comment  In the event of cardiac or respiratory ARREST Do not call a "code blue"   In the event of cardiac or respiratory ARREST Do not perform Intubation, CPR, defibrillation or ACLS   In the event of cardiac or respiratory ARREST Use medication by any route, position, wound care, and other measures to relive pain and suffering. May use oxygen, suction and manual treatment of airway obstruction as needed for comfort.      11/07/15 4098    Code Status History    Date Active  Date Inactive Code Status Order ID Comments User Context   11/07/2015  5:26 AM 11/07/2015  6:24 AM Full Code [id number]  Ihor Austin, MD ED    Advance Directive Documentation        Most Recent Value   Type of Advance Directive  Out of facility DNR (pink MOST or yellow form)   Pre-existing out of facility DNR order (yellow form or pink MOST form)  Physician notified to receive inpatient order   "MOST" Form in Place?        TOTAL TIME TAKING CARE OF THIS PATIENT: 35 minutes.    Note: This dictation was prepared with Dragon dictation along with smaller phrase technology. Any transcriptional errors that result from this process are unintentional.  Yorel Redder M.D on 11/09/2015 at 8:44 AM  Between 7am to 6pm - Pager - [phone] After 6pm go to [email or web address] - password [email or web address] [facility]  [place] [facility]  Office  [phone]  CC: Primary care physician; Jerl Mina

## 2015-11-09 NOTE — Progress Notes (Signed)
PT worked with patient.  He walked around nursing station, no needs.  Juliene Pina says ok to discharge.

## 2015-11-09 NOTE — Progress Notes (Signed)
Ntg SL administered X1, along with [place] Lovenox and IV MSO4 . Will continue to assess.

## 2015-11-09 NOTE — Progress Notes (Signed)
12 lead EKG obtained for RT. Called to notify MD of results. However, MD currently in emergency/ code situation. Unavailable to review EKG at this time. Report placed in patient's hard chart. Will notified oncoming RN of results. Currently patient resting quietly with no signs of discomfort or diaphoresis. Will continue to monitor.

## 2015-11-09 NOTE — Care Management Important Message (Signed)
Important Message  Patient Details  Name: Benjamin Parks MRN: [id number] Date of Birth: 1918-12-29   Medicare Important Message Given:  Yes    Olegario Messier A Bubber Rothert 11/09/2015, 9:29 AM

## 2015-11-09 NOTE — Clinical Social Work Note (Signed)
Clinical Social Work Assessment  Patient Details  Name: Benjamin Parks MRN: [id number] Date of Birth: [birth year]  Date of referral:  11/09/15               Reason for consult:  Discharge Planning                Permission sought to share information with:  Family Supports Permission granted to share information::  Yes, Verbal Permission Granted  Name::        Agency::     Relationship::   ([name]- Son)  Sport and exercise psychologist Information:     Housing/Transportation Living arrangements for the past 2 months:  [place] ([place]) Source of Information:  Patient, Adult Children, Facility ([facility] Paediatric nurse- Son and [place]) Patient Interpreter Needed:  None Criminal Activity/Legal Involvement Pertinent to Current Situation/Hospitalization:  No - Comment as needed Significant Relationships:  None Lives with:    Do you feel safe going back to the place where you live?  Yes Need for family participation in patient care:  Yes (Comment) ([name]- Son)  Care giving concerns:  Patient has concerns returning home to [place]. Reports he wants assistance during the day.    Social Worker assessment / plan:  CSW was informed by PT that patient did not need any PT follow up. She reports that she's going to suggest Cardiac Rehab. CSW was contacted by Seth Bake- admissions coordinator at [facility] and patient's son [name]. Per Seth Bake patient's son and patient does not feel safe with patient returning to his apartment after discharge. Seth Bake presented- 3 day respite and Private pay STR. Patient's son reported that he was on his way to [facility] and that he would like to discuss the options with patient.   CSW met at bedside with patient and his son. Per patient's sone they have decided that they would like to use patient's respite days at [facility]. CSW called Seth Bake at [facility] and informed her of patient's decision.   Clinical Social Worker was informed  that patient will be medically ready to discharge to [facility] for respite services. Patient and his son are in a agreement with plan. CSW called Seth Bake- admission coordinator at [facility] to confirm that patient's bed is ready. Provided patient's room number 331 and number to call for report [phone] . All discharge information faxed to [facility] via [place].    RN will call report and patient will discharge to [facility] via his son- [name]. There are no other CSW needs at this time. CSW is available if a need were to arise. CSW is signing off.   Employment status:  Retired Forensic scientist:  Medicare PT Recommendations:   (Cardiac Rehab) Information / Referral to community resources:  [place]  Patient/Family's Response to care:  Patient and his sone are in agreement for patient to go to [facility] for respite services.   Patient/Family's Understanding of and Emotional Response to Diagnosis, Current Treatment, and Prognosis:  Patient and his son understand CSW's role and appreciated her assistance.   Emotional Assessment Appearance:  Appears younger than stated age Attitude/Demeanor/Rapport:   (None) Affect (typically observed):  Accepting, Calm, Pleasant Orientation:  Oriented to Place, Oriented to Self, Oriented to  Time, Oriented to Situation Alcohol / Substance use:  Not Applicable Psych involvement (Current and /or in the community):  No (Comment)  Discharge Needs  Concerns to be addressed:  Discharge Planning Concerns Readmission within the  last 30 days:  No Current discharge risk:  None Barriers to Discharge:  No Barriers Identified   Miguel Barrera, LCSW 11/09/2015, 12:08 PM

## 2015-11-09 NOTE — NC FL2 (Signed)
[place] MEDICAID FL2 LEVEL OF CARE SCREENING TOOL     IDENTIFICATION  Patient Name: Benjamin Parks Birthdate: [birth year] Sex: male Admission Date (Current Location): 11/07/2015  [place] and [place] Number:  Chiropodist and Address:  [facility], [address], [place], [place] [address]      Provider Number: [id number]  Attending Physician Name and Address:  Adrian Saran, MD  Relative Name and Phone Number:       Current Level of Care: Hospital Recommended Level of Care: Skilled Nursing Facility Prior Approval Number:    Date Approved/Denied:   PASRR Number:  [id number])  Discharge Plan: SNF    Current Diagnoses: Patient Active Problem List   Diagnosis Date Noted  . Non-STEMI (non-ST elevated myocardial infarction) (HCC) 11/07/2015  . Community acquired pneumonia 11/07/2015  . NSTEMI (non-ST elevated myocardial infarction) (HCC) 11/07/2015    Orientation RESPIRATION BLADDER Height & Weight     Self, Time, Situation, Place  O2 (Nasal Cannula 2 (L/min)) Continent Weight: 179 lb 12.8 oz (81.557 kg) Height:   (177.8 cm)  BEHAVIORAL SYMPTOMS/MOOD NEUROLOGICAL BOWEL NUTRITION STATUS   (None)   Continent Diet (Heart)  AMBULATORY STATUS COMMUNICATION OF NEEDS Skin   Extensive Assist   Normal                       Personal Care Assistance Level of Assistance  Bathing, Feeding, Dressing Bathing Assistance: Limited assistance Feeding assistance: Independent Dressing Assistance: Limited assistance     Functional Limitations Info  Sight, Hearing, Speech Sight Info: Adequate Hearing Info: Adequate Speech Info: Adequate    SPECIAL CARE FACTORS FREQUENCY  PT (By licensed PT)     PT Frequency:  (5)              Contractures Contractures Info:  ([id number])    Additional Factors Info  Code Status, Allergies, Insulin Sliding Scale Code Status Info:  (DNR) Allergies Info:  (Azithromycin)   Insulin Sliding  Scale Info:  (insulin aspart (novoLOG) injection 0-5 Units 0-5 Units, Subcutaneous, Daily at bedtime  & insulin aspart (novoLOG) injection 0-9 Units 0-9 Units, Subcutaneous, 3 times daily with meals)       Current Medications (11/09/2015):  This is the current hospital active medication list Current Facility-Administered Medications  Medication Dose Route Frequency Provider Last Rate Last Dose  . 0.9 %  sodium chloride infusion   Intravenous Continuous Ihor Austin, MD 50 mL/hr at 11/08/15 2025    . acetaminophen (TYLENOL) tablet 650 mg  650 mg Oral Q4H PRN Ihor Austin, MD      . aspirin EC tablet 81 mg  81 mg Oral Daily Ihor Austin, MD   81 mg at 11/09/15 0550  . atorvastatin (LIPITOR) tablet 80 mg  80 mg Oral q1800 Dalia Heading, MD   80 mg at 11/08/15 1733  . clopidogrel (PLAVIX) tablet 75 mg  75 mg Oral Daily Dalia Heading, MD   75 mg at 11/08/15 1733  . docusate sodium (COLACE) capsule 100 mg  100 mg Oral Daily PRN Pavan Pyreddy, MD      . enoxaparin (LOVENOX) injection 80 mg  1 mg/kg Subcutaneous Q12H Pavan Pyreddy, MD   80 mg at 11/09/15 0540  . insulin aspart (novoLOG) injection 0-5 Units  0-5 Units Subcutaneous QHS Adrian Saran, MD   0 Units at 11/07/15 2200  . insulin aspart (novoLOG) injection 0-9 Units  0-9 Units Subcutaneous TID WC Adrian Saran, MD  2 Units at 11/08/15 0816  . levofloxacin (LEVAQUIN) IVPB 500 mg  500 mg Intravenous Q24H Sheema M Hallaji, RPH   500 mg at 11/08/15 0954  . lisinopril (PRINIVIL,ZESTRIL) tablet 2.5 mg  2.5 mg Oral Daily Dalia Heading, MD   2.5 mg at 11/08/15 1732  . metoprolol tartrate (LOPRESSOR) tablet 25 mg  25 mg Oral BID Ihor Austin, MD   25 mg at 11/08/15 2215  . morphine 2 MG/ML injection 2 mg  2 mg Intravenous Q4H PRN Ihor Austin, MD   2 mg at 11/09/15 0540  . nitroGLYCERIN (NITROSTAT) SL tablet 0.4 mg  0.4 mg Sublingual Q5 Min x 3 PRN Ihor Austin, MD   0.4 mg at 11/09/15 0545  . ondansetron (ZOFRAN) injection 4 mg  4 mg Intravenous Q6H  PRN Ihor Austin, MD         Discharge Medications: Please see discharge summary for a list of discharge medications.  Relevant Imaging Results:  Relevant Lab Results:   Additional Information    Verta Ellen Argusta Mcgann, LCSW

## 2015-11-09 NOTE — Evaluation (Signed)
Physical Therapy Evaluation Patient Details Name: Benjamin Parks MRN: [id number] DOB: December 10, 1918 Today's Date: 11/09/2015   History of Present Illness  Pt is a 82 y.o. male presenting to hospital with chest pain with elevated troponin.  Pt admitted to hospital with unstable angina, Non-STEMI, and community acquired PNA.  Pt s/p L heart cath and coronary angiography 11/08/15 (plan to attempt medical therapy for findings).  PMH includes R BBB, PVC, CVA, and DM.  Clinical Impression  Prior to admission, pt was independent with transfers and ambulation without AD.  Pt has a CNA that spends nights at his home d/t pt unable to get OOB on his own baseline from "poor core strength" per pt report.  Pt lives at the [facility] [place].  Currently pt is mod assist supine to sit but modified independent sit to supine and independent with transfers and gait (no AD).  No loss of balance noted during session and pt scored 28/28 on Tinetti balance assessment indicating pt is at low risk for falls.  Pt would benefit from cardiac rehab upon discharge.  Recommend pt discharge to home when medically appropriate.  No further acute hospital PT needs identified; discharge pt in house from PT.     Follow Up Recommendations  (cardiac rehab)    Equipment Recommendations  None recommended by PT    Recommendations for Other Services       Precautions / Restrictions Precautions Precautions: Fall Precaution Comments: No heavy lifting exertion for 1 week Restrictions Weight Bearing Restrictions: No      Mobility  Bed Mobility Overal bed mobility: Needs Assistance Bed Mobility: Supine to Sit;Sit to Supine     Supine to sit: Mod assist Sit to supine: Modified independent (Device/Increase time)   General bed mobility comments: assist for trunk supine to sit (pt reports having poor core strength baseline and normally needs assist)  Transfers Overall transfer level: Independent Equipment  used: None Transfers: Sit to/from [employer] to Stand: Independent Stand pivot transfers: Independent       General transfer comment: steady without loss of balance  Ambulation/Gait Ambulation/Gait assistance: Independent Ambulation Distance (Feet): 220 Feet Assistive device: None     Gait velocity interpretation: at or above normal speed for age/gender General Gait Details: mild increased BOS and B LE externally rotated (pt reports he walks this way baseline); no loss of balance with ambulation or ambulation with head turns R/L/up/down, increasing/decreasing speed, or turning and stopping  Careers information officer    Modified Rankin (Stroke Patients Only)       Balance Overall balance assessment: Independent                               Standardized Balance Assessment Standardized Balance Assessment :  (Pt scored 28/28 on Tinetti balance assessment indicating pt is at low risk for falls)           Pertinent Vitals/Pain Pain Assessment: No/denies pain  Vitals stable and WFL throughout treatment session.    Home Living Family/patient expects to be discharged to:: Assisted living (Independent living at [facility])               Home Equipment: None      Prior Function Level of Independence: Independent         Comments: Pt reports 1 fall backwards 3-4 weeks ago (pt unsure  why) but no other falls.  Pt reports being active and driving; uses aquatic pool.     Hand Dominance        Extremity/Trunk Assessment   Upper Extremity Assessment: Overall WFL for tasks assessed           Lower Extremity Assessment: Overall WFL for tasks assessed      Cervical / Trunk Assessment: Normal  Communication   Communication: HOH  Cognition Arousal/Alertness: Awake/alert Behavior During Therapy: WFL for tasks assessed/performed Overall Cognitive Status: Within Functional Limits for tasks assessed                       General Comments   Nursing cleared pt for participation in physical therapy.  Pt agreeable to PT session.    Exercises        Assessment/Plan    PT Assessment Patent does not need any further PT services  PT Diagnosis Other (comment) (cardiac impairment)   PT Problem List Cardiopulmonary status limiting activity  PT Treatment Interventions     PT Goals (Current goals can be found in the Care Plan section) Acute Rehab PT Goals Patient Stated Goal: to be able to walk again PT Goal Formulation: With patient Time For Goal Achievement: 11/23/15 Potential to Achieve Goals: Good    Frequency     Barriers to discharge        Co-evaluation               End of Session Equipment Utilized During Treatment: Gait belt Activity Tolerance: Patient tolerated treatment well Patient left: in chair;with call bell/phone within reach;with chair alarm set Nurse Communication: Mobility status         Time: 1033-1050 PT Time Calculation (min) (ACUTE ONLY): 17 min   Charges:   PT Evaluation $PT Eval Low Complexity: 1 Procedure     PT G CodesHendricks Limes 11/19/15, 11:09 AM Hendricks Limes, PT [phone]

## 2015-11-09 NOTE — Progress Notes (Signed)
Removed telemetry and removed PIV. Given Rx, patients family at bedside.   Called report to [facility].    Son to take patient to [facility].  No questions at this time.  To be escorted out of hospital via wheelchair by volunteers after lunch.

## 2015-11-09 NOTE — Progress Notes (Signed)
VS obtained prior to second dose of Ntg SL, which was given at this time. VS: 135/72 HR 64 O2 93% on 2L

## 2015-11-16 DIAGNOSIS — E782 Mixed hyperlipidemia: Secondary | ICD-10-CM | POA: Insufficient documentation

## 2015-11-16 DIAGNOSIS — I10 Essential (primary) hypertension: Secondary | ICD-10-CM | POA: Insufficient documentation

## 2015-11-16 DIAGNOSIS — I2510 Atherosclerotic heart disease of native coronary artery without angina pectoris: Secondary | ICD-10-CM | POA: Insufficient documentation

## 2015-12-22 ENCOUNTER — Emergency Department: Payer: Medicare Other

## 2015-12-22 ENCOUNTER — Emergency Department
Admission: EM | Admit: 2015-12-22 | Discharge: 2015-12-22 | Disposition: A | Discharge status: Home / Self Care | Payer: Medicare Other | Attending: Emergency Medicine | Admitting: Emergency Medicine

## 2015-12-22 ENCOUNTER — Encounter: Payer: Self-pay | Admitting: Emergency Medicine

## 2015-12-22 DIAGNOSIS — E119 Type 2 diabetes mellitus without complications: Secondary | ICD-10-CM | POA: Insufficient documentation

## 2015-12-22 DIAGNOSIS — I252 Old myocardial infarction: Secondary | ICD-10-CM | POA: Insufficient documentation

## 2015-12-22 DIAGNOSIS — I10 Essential (primary) hypertension: Secondary | ICD-10-CM | POA: Insufficient documentation

## 2015-12-22 DIAGNOSIS — Z79899 Other long term (current) drug therapy: Secondary | ICD-10-CM | POA: Insufficient documentation

## 2015-12-22 DIAGNOSIS — Z7984 Long term (current) use of oral hypoglycemic drugs: Secondary | ICD-10-CM | POA: Diagnosis not present

## 2015-12-22 DIAGNOSIS — Z7982 Long term (current) use of aspirin: Secondary | ICD-10-CM | POA: Insufficient documentation

## 2015-12-22 DIAGNOSIS — Z8673 Personal history of transient ischemic attack (TIA), and cerebral infarction without residual deficits: Secondary | ICD-10-CM | POA: Diagnosis not present

## 2015-12-22 DIAGNOSIS — H53132 Sudden visual loss, left eye: Secondary | ICD-10-CM | POA: Diagnosis present

## 2015-12-22 DIAGNOSIS — Z8679 Personal history of other diseases of the circulatory system: Secondary | ICD-10-CM | POA: Insufficient documentation

## 2015-12-22 DIAGNOSIS — H3530 Unspecified macular degeneration: Secondary | ICD-10-CM | POA: Diagnosis not present

## 2015-12-22 DIAGNOSIS — E785 Hyperlipidemia, unspecified: Secondary | ICD-10-CM | POA: Diagnosis not present

## 2015-12-22 HISTORY — DX: Old myocardial infarction: I25.2

## 2015-12-22 LAB — COMPREHENSIVE METABOLIC PANEL
ALT: 20 U/L (ref 17–63)
AST: 24 U/L (ref 15–41)
Albumin: 3.9 g/dL (ref 3.5–5.0)
Alkaline Phosphatase: 56 U/L (ref 38–126)
Anion gap: 5 (ref 5–15)
BUN: 18 mg/dL (ref 6–20)
CHLORIDE: 105 mmol/L (ref 101–111)
CO2: 26 mmol/L (ref 22–32)
Calcium: 8.6 mg/dL — ABNORMAL LOW (ref 8.9–10.3)
Creatinine, Ser: 0.99 mg/dL (ref 0.61–1.24)
GFR calc Af Amer: >60 mL/min (ref >60)
Glucose, Bld: 113 mg/dL — ABNORMAL HIGH (ref 65–99)
POTASSIUM: 4.2 mmol/L (ref 3.5–5.1)
SODIUM: 136 mmol/L (ref 135–145)
Total Bilirubin: 1 mg/dL (ref 0.3–1.2)
Total Protein: 6.7 g/dL (ref 6.5–8.1)

## 2015-12-22 LAB — TROPONIN I

## 2015-12-22 LAB — DIFFERENTIAL
BASOS PCT: 1 %
Basophils Absolute: 0 10*3/uL (ref 0–0.1)
EOS ABS: 0.3 10*3/uL (ref 0–0.7)
Eosinophils Relative: 5 %
Lymphocytes Relative: 18 %
Lymphs Abs: 1.1 10*3/uL (ref 1.0–3.6)
MONOS PCT: 12 %
Monocytes Absolute: 0.8 10*3/uL (ref 0.2–1.0)
NEUTROS PCT: 64 %
Neutro Abs: 4 10*3/uL (ref 1.4–6.5)

## 2015-12-22 LAB — CBC
HCT: 40 % (ref 40.0–52.0)
HEMOGLOBIN: 13.4 g/dL (ref 13.0–18.0)
MCH: 30.7 pg (ref 26.0–34.0)
MCHC: 33.6 g/dL (ref 32.0–36.0)
MCV: 91.4 fL (ref 80.0–100.0)
PLATELETS: 192 10*3/uL (ref 150–440)
RBC: 4.37 MIL/uL — AB (ref 4.40–5.90)
RDW: 14 % (ref 11.5–14.5)
WBC: 6.3 10*3/uL (ref 3.8–10.6)

## 2015-12-22 LAB — SEDIMENTATION RATE: SED RATE: 9 mm/h (ref 0–20)

## 2015-12-22 LAB — PROTIME-INR
INR: 1.18
PROTHROMBIN TIME: 15.2 s — AB (ref 11.4–15.0)

## 2015-12-22 LAB — APTT: aPTT: 35 seconds (ref 24–36)

## 2015-12-22 NOTE — ED Notes (Signed)
Pt states he noticed a difference in his vision yesterday while reading. Pt states that when covering his right eye that everything is darker out of the left eye.

## 2015-12-22 NOTE — ED Provider Notes (Signed)
[facility] Emergency Department Provider Note  ____________________________________________  Time seen: Approximately 6:30 PM  I have reviewed the triage vital signs and the nursing notes.   HISTORY  Chief Complaint Loss of Vision    HPI Benjamin Parks is a 82 y.o. male history of a recent heart attack, diabetes, hypertension and previous stroke.  Yesterday he noticed while reading his book in the evening that he is having trouble discerning words with the left eye. He went to bed and today continues to feel like he is having some just gentle loss of vision in the left eye. No black spots, no complete loss or blindness. No pain in the eye, headache, numbness tingling weakness or other concerns. Just feels like he has a graying and loss of some vision in the left eye.   Past Medical History  Diagnosis Date  . Right bundle branch block   . PVC (premature ventricular contraction)   . Diabetes mellitus without complication (HCC)   . Hypertension   . Hyperlipidemia   . Stroke (HCC)   . History of heart attack     Patient Active Problem List   Diagnosis Date Noted  . Non-STEMI (non-ST elevated myocardial infarction) (HCC) 11/07/2015  . Community acquired pneumonia 11/07/2015  . NSTEMI (non-ST elevated myocardial infarction) (HCC) 11/07/2015    Past Surgical History  Procedure Laterality Date  . None    . Cardiac catheterization N/A 11/08/2015    Procedure: Left Heart Cath and Coronary Angiography;  Surgeon: Dalia Heading, MD;  Location: [facility] INVASIVE CV LAB;  Service: Cardiovascular;  Laterality: N/A;    Current Outpatient Rx  Name  Route  Sig  Dispense  Refill  . aspirin EC 81 MG EC tablet   Oral   Take 1 tablet (81 mg total) by mouth daily.   30 tablet   0   . atorvastatin (LIPITOR) 80 MG tablet   Oral   Take 0.5 tablets (40 mg total) by mouth daily at 6 PM.   30 tablet   0   . citalopram (CELEXA) 20 MG tablet   Oral   Take 20 mg by  mouth daily.         . clopidogrel (PLAVIX) 75 MG tablet   Oral   Take 1 tablet (75 mg total) by mouth daily.   30 tablet   0   . glipiZIDE (GLUCOTROL) 5 MG tablet   Oral   Take 0.5 tablets (2.5 mg total) by mouth daily before breakfast.   30 tablet   0   . Lancets (ACCU-CHEK MULTICLIX) lancets      Use as instructed   100 each   12   . levofloxacin (LEVAQUIN) 500 MG tablet   Oral   Take 1 tablet (500 mg total) by mouth daily.   3 tablet   0   . lisinopril (PRINIVIL,ZESTRIL) 2.5 MG tablet   Oral   Take 1 tablet (2.5 mg total) by mouth daily.   30 tablet   0   . metoprolol tartrate (LOPRESSOR) 25 MG tablet   Oral   Take 1 tablet (25 mg total) by mouth 2 (two) times daily.   30 tablet   0   . nitroGLYCERIN (NITROSTAT) 0.4 MG SL tablet   Sublingual   Place 1 tablet (0.4 mg total) under the tongue every 5 (five) minutes x 3 doses as needed for chest pain.   30 tablet   0   . selenium 50 MCG TABS  tablet   Oral   Take 200 mcg by mouth daily.           Allergies Azithromycin  Family History  Problem Relation Age of Onset  . Heart attack Mother     deceased  . Heart attack Father     deceased    Social History Social History  Substance Use Topics  . Smoking status: Never Smoker   . Smokeless tobacco: None  . Alcohol Use: 0.0 oz/week    0 Standard drinks or equivalent per week     Comment: one cocktail every day.    Review of Systems Constitutional: No fever/chills Eyes: No right eye trouble. ENT: No sore throat. Cardiovascular: Denies chest pain. Respiratory: Denies shortness of breath. Gastrointestinal: No abdominal pain.  No nausea, no vomiting.  No diarrhea.  No constipation. Genitourinary: Negative for dysuria. Musculoskeletal: Negative for back pain. Skin: Negative for rash. Neurological: Negative for headaches, focal weakness or numbness.  10-point ROS otherwise negative.  ____________________________________________   PHYSICAL  EXAM:  VITAL SIGNS: ED Triage Vitals  Enc Vitals Group     BP 12/22/15 1736 129/61 mmHg     Pulse Rate 12/22/15 1736 62     Resp 12/22/15 1736 18     Temp 12/22/15 1736 97.5 F (36.4 C)     Temp Source 12/22/15 1736 Oral     SpO2 12/22/15 1736 97 %     Weight 12/22/15 1736 170 lb (77.111 kg)     Height 12/22/15 1736 5\' 10"  (1.778 m)     Head Cir --      Peak Flow --      Pain Score --      Pain Loc --      Pain Edu? --      Excl. in GC? --    Constitutional: Alert and oriented. Well appearing and in no acute distress. Eyes: Conjunctivae are normal. PERRL. EOMI.Exam of the retina by myself demonstrates crisp vessels and no clear obvious change based on my gross assessment. No evidence of retinal hemorrhage is noted. Patient does have decreased acuity in the left eye compared to the right but fields all seem to be intact. Head: Atraumatic. Nose: No congestion/rhinnorhea. Mouth/Throat: Mucous membranes are moist.  Oropharynx non-erythematous. Neck: No stridor.   Cardiovascular: Normal rate, regular rhythm. Grossly normal heart sounds.  Good peripheral circulation. Respiratory: Normal respiratory effort.  No retractions. Lungs CTAB. Gastrointestinal: Soft and nontender. Musculoskeletal: No lower extremity tenderness nor edema.   Neurologic:  Normal speech and language. No gross focal neurologic deficits are appreciated. No gait instability. Skin:  Skin is warm, dry and intact. No rash noted. Psychiatric: Mood and affect are normal. Speech and behavior are normal.  ____________________________________________   LABS (all labs ordered are listed, but only abnormal results are displayed)  Labs Reviewed  PROTIME-INR - Abnormal; Notable for the following:    Prothrombin Time 15.2 (*)    All other components within normal limits  CBC - Abnormal; Notable for the following:    RBC 4.37 (*)    All other components within normal limits  COMPREHENSIVE METABOLIC PANEL - Abnormal;  Notable for the following:    Glucose, Bld 113 (*)    Calcium 8.6 (*)    All other components within normal limits  APTT  DIFFERENTIAL  TROPONIN I  SEDIMENTATION RATE   ____________________________________________  EKG  Reviewed and interpreted by me at 1835 Ventricular rate 60 QRS 150 QTc 470 Sinus rhythm Right bundle-branch  block T wave abnormality seen in inferolateral distribution, no ST elevation. Compared with previous EKG from February no significant changes noted ____________________________________________  RADIOLOGY  Final result by Rad Results In Interface (12/22/15 18:10:44)   Narrative:   CLINICAL DATA: Vision difficulty  EXAM: CT HEAD WITHOUT CONTRAST  TECHNIQUE: Contiguous axial images were obtained from the base of the skull through the vertex without intravenous contrast.  COMPARISON: None.  FINDINGS: The bony calvarium is intact. Diffuse atrophic changes are noted. Chronic white matter ischemic changes are noted. No acute infarcts or acute hemorrhage is seen. No mass lesion is noted.  IMPRESSION: Chronic atrophic and ischemic changes without acute abnormality.   Electronically Signed By: Alcide Clever M.D. On: 12/22/2015 18:10       ____________________________________________   PROCEDURES  Procedure(s) performed: None  Critical Care performed: No  ____________________________________________   INITIAL IMPRESSION / ASSESSMENT AND PLAN / ED COURSE  Pertinent labs & imaging results that were available during my care of the patient were reviewed by me and considered in my medical decision making (see chart for details).  Patient presents for pain illness visual loss, rather decreased acuity, over the last day. Really no symptoms other than just decreased acuity. No signs such as headache, fever, numbness tingling weakness or neurologic abnormality on exam. He is awake alert and well-appearing. No temporal artery tenderness  and he has normal pulsations bilateral.  Based on concern for decreased acuity did discuss with ophthalmology who will see in consult.  Patient seen and examined by Dr. Frederik Schmidt of ophthalmology in the ER. She reports the patient has clinical findings and history consistent with age-related macular degeneration. She advises no medications, but PCP and ophthalmology follow-up. Patient and family including his son are very agreeable. Return precautions discussed. No evidence to support acute neurologic deficit.   ____________________________________________   FINAL CLINICAL IMPRESSION(S) / ED DIAGNOSES  Final diagnoses:  Macular degeneration, left eye      Sharyn Creamer, MD 12/23/15 [id number]

## 2015-12-22 NOTE — Progress Notes (Signed)
Reason for Consult: vision loss left eye Referring Physician: [name] is an 82 y.o. male.  Chief complaint: burred vision left eye HPI: Yesterday noticed that it was harder to read.  This feeling has persisted to today. Blur is limited to left eye only.  No pain, no flashing lights, no new floaters.  This has never happened before.  Past Medical History  Diagnosis Date  . Right bundle branch block   . PVC (premature ventricular contraction)   . Diabetes mellitus without complication ([place])   . Hypertension   . Hyperlipidemia   . Stroke ([place])   . History of heart attack     ROS reviewed, all negative  Past Surgical History  Procedure Laterality Date  . None    . Cardiac catheterization N/A 11/08/2015    Procedure: Left Heart Cath and Coronary Angiography;  Surgeon: Benjamin Spray, MD;  Location: [place] CV LAB;  Service: Cardiovascular;  Laterality: N/A;    Family History  Problem Relation Age of Onset  . Heart attack Mother     deceased  . Heart attack Father     deceased    Social History:  reports that he has never smoked. He does not have any smokeless tobacco history on file. He reports that he drinks alcohol. He reports that he does not use illicit drugs.  Allergies:  Allergies  Allergen Reactions  . Azithromycin     Medications: I have reviewed the patient's current medications.  Results for orders placed or performed during the hospital encounter of 12/22/15 (from the past 48 hour(s))  Protime-INR     Status: Abnormal   Collection Time: 12/22/15  6:37 PM  Result Value Ref Range   Prothrombin Time 15.2 (H) 11.4 - 15.0 seconds   INR 1.18   APTT     Status: None   Collection Time: 12/22/15  6:37 PM  Result Value Ref Range   aPTT 35 24 - 36 seconds  CBC     Status: Abnormal   Collection Time: 12/22/15  6:37 PM  Result Value Ref Range   WBC 6.3 3.8 - 10.6 K/uL   RBC 4.37 (L) 4.40 - 5.90 MIL/uL   Hemoglobin 13.4 13.0 - 18.0 g/dL   HCT 40.0 40.0 - 52.0 %   MCV 91.4 80.0 - 100.0 fL   MCH 30.7 26.0 - 34.0 pg   MCHC 33.6 32.0 - 36.0 g/dL   RDW 14.0 11.5 - 14.5 %   Platelets 192 150 - 440 K/uL  Differential     Status: None   Collection Time: 12/22/15  6:37 PM  Result Value Ref Range   Neutrophils Relative % 64 %   Neutro Abs 4.0 1.4 - 6.5 K/uL   Lymphocytes Relative 18 %   Lymphs Abs 1.1 1.0 - 3.6 K/uL   Monocytes Relative 12 %   Monocytes Absolute 0.8 0.2 - 1.0 K/uL   Eosinophils Relative 5 %   Eosinophils Absolute 0.3 0 - 0.7 K/uL   Basophils Relative 1 %   Basophils Absolute 0.0 0 - 0.1 K/uL  Comprehensive metabolic panel     Status: Abnormal   Collection Time: 12/22/15  6:37 PM  Result Value Ref Range   Sodium 136 135 - 145 mmol/L   Potassium 4.2 3.5 - 5.1 mmol/L   Chloride 105 101 - 111 mmol/L   CO2 26 22 - 32 mmol/L   Glucose, Bld 113 (H) 65 - 99 mg/dL   BUN 18  6 - 20 mg/dL   Creatinine, Ser 0.99 0.61 - 1.24 mg/dL   Calcium 8.6 (L) 8.9 - 10.3 mg/dL   Total Protein 6.7 6.5 - 8.1 g/dL   Albumin 3.9 3.5 - 5.0 g/dL   AST 24 15 - 41 U/L   ALT 20 17 - 63 U/L   Alkaline Phosphatase 56 38 - 126 U/L   Total Bilirubin 1.0 0.3 - 1.2 mg/dL   GFR calc non Af Amer >60 >60 mL/min   GFR calc Af Amer >60 >60 mL/min    Comment: (NOTE) The eGFR has been calculated using the CKD EPI equation. This calculation has not been validated in all clinical situations. eGFR's persistently <60 mL/min signify possible Chronic Kidney Disease.    Anion gap 5 5 - 15  Troponin I     Status: None   Collection Time: 12/22/15  6:37 PM  Result Value Ref Range   Troponin I <0.03 <0.031 ng/mL    Comment:        NO INDICATION OF MYOCARDIAL INJURY.   Sedimentation rate     Status: None   Collection Time: 12/22/15  6:37 PM  Result Value Ref Range   Sed Rate 9 0 - 20 mm/hr    Ct Head Wo Contrast  12/22/2015  CLINICAL DATA:  Vision difficulty EXAM: CT HEAD WITHOUT CONTRAST TECHNIQUE: Contiguous axial images were obtained from  the base of the skull through the vertex without intravenous contrast. COMPARISON:  None. FINDINGS: The bony calvarium is intact. Diffuse atrophic changes are noted. Chronic white matter ischemic changes are noted. No acute infarcts or acute hemorrhage is seen. No mass lesion is noted. IMPRESSION: Chronic atrophic and ischemic changes without acute abnormality. Electronically Signed   By: Inez Catalina M.D.   On: 12/22/2015 18:10    Blood pressure 129/61, pulse 62, temperature 97.5 F (36.4 C), temperature source Oral, resp. rate 18, height 5' 10" (1.778 m), weight 170 lb (77.111 kg), SpO2 97 %.  Mental status: Alert and Oriented x 4  Visual Acuity:  20/25 OD  20/50 OS near cc  Pupils:  Equally round/ reactive to light.  No relative afferent pupillary defect OU.  Motility:  Full/ orthophoric  Visual Fields:  Full to confrontation OU  IOP:  15 OU goldmann  External/ Lids/ Lashes:  Normal  Anterior Segment:  Conjunctiva:  Normal  OU  Cornea:  Normal  OU  Anterior Chamber: Normal  OU  Lens:   Pseudophakia OU  Posterior Segment: Dilated OU with 1% Tropicamide and 2.5% Phenylephrine  Discs:   C/d 0.9 OD, 0.8 oS, no pallor, no edema OU  Macula:  Drusen OD no heme or SRF; heme in fovea OS  Vessels/ Periphery: Normal    Assessment/Plan: 1. New exudative age-related macular degeneration OS: will set up patient with retina specialist at [facility] soon. No acute intervention needed 2. NNVAMD OD 3. Pseudophakia OU  Benjamin Parks [facility] 12/22/2015, 7:48 PM

## 2015-12-22 NOTE — Discharge Instructions (Signed)
Age-Related Macular Degeneration Age-related macular degeneration (AMD) is a common eye disease related to aging. AMD slowly destroys the macula, which is the focusing part of the eye that provides sharp, central vision. Central vision is needed for seeing objects clearly and for daily tasks such as reading and driving. In some people, AMD advances so slowly that it has little effect on their vision as they age. In others, the disease progresses quickly and may lead to a loss of vision in one or both eyes. There are two major types of AMD:  Dry AMD. This is the more common type. It leads to the slow breakdown of the light-sensing cells in the macula and a slow loss of central vision.  Wet AMD. As dry AMD worsens, abnormal blood vessels may begin to grow causing wet AMD. These blood vessels often leak blood and fluid under the macula because these vessels are often very fragile. This causes rapid damage to the macula that can lead to the loss of detailed central vision in a short period of time. RISK FACTORS  Older age (80 years of age or older).  Smoking.  Being very overweight (obese).  Having a family history of AMD.  Having high cholesterol, high blood pressure, or other forms of heart disease.  Exposure to high levels of ultraviolet (UV) light and blue light.  Being Caucasian.  Being a woman. SYMPTOMS Dry AMD  Blurred vision, especially when reading print material. This blurred vision will often go away in brighter light.  Small, but growing blurred or blind spot in the center of your field of vision.  Decrease in the intensity of colors. Things may not seem as bright as they once were.  Decreased ability to recognize faces.  One eye may seem to be worse than the other.  Decreased ability to adapt to dimly lit rooms. Wet AMD  All of the above symptoms, and you may notice:  That straight lines appear crooked or wavy.  A small blind spot, which can result in the loss of  central vision. DIAGNOSIS Your caregiver will ask questions regarding recent changes to your central vision and will perform a complete eye exam. Eye drops will be placed in your eyes to enlarge (dilate) your pupils. Dilating the pupils allows your caregiver to view the back of the eye better. You may also be given a fluorescein angiogram test that injects a small amount of dye into a vein. A camera takes pictures of the dye as it travels through the blood vessels of the retina. This test can determine if you have dry or wet AMD. Lastly, you may also be asked to view an Amsler grid, which is a patterned image that looks like a checkerboard. Early changes in your central vision will cause the grid to appear distorted.  TREATMENT There is no cure for AMD. However, the progression of the disease can be slowed. The following may help slow the progression of AMD:  Vitamins C and E, beta carotene, zinc, and antioxidants.  Laser surgery that directs a strong beam of light on the source of leakage or on new blood vessels in the eye to destroy them.  Injections of medicines into the eye. The injection slows down the formation of abnormal blood vessels that might leak. HOME CARE INSTRUCTIONS  Schedule and keep annual eye exams. Your caregiver may recommend more frequent eye exams. Do as directed.  Take dietary supplements as directed by your caregiver.  Ask your caregiver for an Amsler  grid.  Check your vision every day with an Amsler grid to find any vision changes.  Check one eye at a time by covering the eye you are not testing. SEEK MEDICAL CARE IF: You notice any further changes in your vision.   This information is not intended to replace advice given to you by your health care provider. Make sure you discuss any questions you have with your health care provider.   Document Released: 12/09/2007 Document Revised: 05/26/2012 Document Reviewed: 03/16/2012 Elsevier Interactive Patient Education  [employer]

## 2016-09-20 ENCOUNTER — Encounter: Payer: Self-pay | Admitting: Emergency Medicine

## 2016-09-20 ENCOUNTER — Emergency Department
Admission: EM | Admit: 2016-09-20 | Discharge: 2016-09-20 | Disposition: A | Discharge status: Home / Self Care | Payer: Medicare Other | Attending: Emergency Medicine | Admitting: Emergency Medicine

## 2016-09-20 DIAGNOSIS — G8918 Other acute postprocedural pain: Secondary | ICD-10-CM | POA: Diagnosis not present

## 2016-09-20 DIAGNOSIS — Z79899 Other long term (current) drug therapy: Secondary | ICD-10-CM | POA: Insufficient documentation

## 2016-09-20 DIAGNOSIS — Z7982 Long term (current) use of aspirin: Secondary | ICD-10-CM | POA: Insufficient documentation

## 2016-09-20 DIAGNOSIS — E119 Type 2 diabetes mellitus without complications: Secondary | ICD-10-CM | POA: Insufficient documentation

## 2016-09-20 DIAGNOSIS — Z7984 Long term (current) use of oral hypoglycemic drugs: Secondary | ICD-10-CM | POA: Insufficient documentation

## 2016-09-20 DIAGNOSIS — H5712 Ocular pain, left eye: Secondary | ICD-10-CM

## 2016-09-20 DIAGNOSIS — I10 Essential (primary) hypertension: Secondary | ICD-10-CM | POA: Diagnosis not present

## 2016-09-20 MED ORDER — ACETAMINOPHEN 325 MG PO TABS
650.0000 mg | ORAL_TABLET | Freq: Once | ORAL | Status: AC
  Administered 2016-09-20: 650 mg via ORAL
  Filled 2016-09-20: qty 2

## 2016-09-20 NOTE — Consult Note (Signed)
Reason for Consult:pain OS Referring Physician: ER [name]  Benjamin Parks is an 83 y.o. male.  Chief complaint: pain OS after injection in office yesterday <principal problem not specified>  HPI: 89 yop WM with ARMD with pain and blurry Va after Avastin injection OS yesterday in office. POH -IOL OU, wet ARMD OU  Past Medical History:  Diagnosis Date  . Diabetes mellitus without complication (HCC)   . History of heart attack   . Hyperlipidemia   . Hypertension   . PVC (premature ventricular contraction)   . Right bundle branch block   . Stroke (HCC)     ROS  Past Surgical History:  Procedure Laterality Date  . CARDIAC CATHETERIZATION N/A 11/08/2015   Procedure: Left Heart Cath and Coronary Angiography;  Surgeon: Dalia HeadingKenneth A Fath, MD;  Location: [facility] INVASIVE CV LAB;  Service: Cardiovascular;  Laterality: N/A;  . none      Family History  Problem Relation Age of Onset  . Heart attack Mother     deceased  . Heart attack Father     deceased    Social History:  reports that he has never smoked. He does not have any smokeless tobacco history on file. He reports that he drinks alcohol. He reports that he does not use drugs.  Allergies:  Allergies  Allergen Reactions  . Azithromycin     Medications: Prior to Admission:  (Not in a hospital admission)  No results found for this or any previous visit (from the past 48 hour(s)). No results found.  Blood pressure 104/79, pulse (!) 58, temperature 98.8 F (37.1 C), temperature source Oral, resp. rate 18, height 5\' 10"  (1.778 m), weight 77.1 kg (170 lb), SpO2 100 %.  Mental status: Alert and Oriented x 4  Visual Acuity:  J5 OD  J16 near [place]  Pupils:  Equally round/ reactive to light.  No Afferent defect.  Motili  IOP:  deferred  External/ Lids/ Lashes:  Normal  Anterior Segment:  Conjunctiva:  2+ inj OS  Cornea:  EBMD OD, Abrasion OS  Anterior Chamber: D/quiet  OU  Lens:   IOL OU    Assessment/Plan: Corneal  Abrasion OS- Bandage CTl, Vigamox TID, Ilevro Qday F/u 2 days  Terie Lear 09/20/2016, 11:57 AM

## 2016-09-20 NOTE — ED Notes (Signed)
Pt in eye room with opthalmologist for exam. Will return to room after exam is finished.

## 2016-09-20 NOTE — Discharge Instructions (Signed)
Go directly to the [facility] for more evaluation with ophthalmologist.  Return to the emergency department for any worsening pain, change in vision including blurry vision or double vision, fever, drainage from the eye, or any other symptoms concerning to you.

## 2016-09-20 NOTE — ED Triage Notes (Signed)
States had procedure L eye yesterday at [facility], increasingly painful since.

## 2016-09-20 NOTE — ED Notes (Signed)
Pt will be discharged to follow up with eye doctor at office.

## 2016-09-20 NOTE — ED Provider Notes (Signed)
[facility] Emergency Department Provider Note ____________________________________________   I have reviewed the triage vital signs and the triage nursing note.  HISTORY  Chief Complaint Eye Pain   [facility]  HPI Benjamin Parks is a 83 y.o. male is here with left eye pain since yesterday, states that he had a procedure for macular degeneration at [facility] yesterday. He states that he's been using drops, but he does not know what drops they are.  No nausea or vomiting.  Pain in the eye is moderate to severe.    Past Medical History:  Diagnosis Date  . Diabetes mellitus without complication (HCC)   . History of heart attack   . Hyperlipidemia   . Hypertension   . PVC (premature ventricular contraction)   . Right bundle branch block   . Stroke [facility](HCC)     Patient Active Problem List   Diagnosis Date Noted  . Non-STEMI (non-ST elevated myocardial infarction) (HCC) 11/07/2015  . Community acquired pneumonia 11/07/2015  . NSTEMI (non-ST elevated myocardial infarction) (HCC) 11/07/2015    Past Surgical History:  Procedure Laterality Date  . CARDIAC CATHETERIZATION N/A 11/08/2015   Procedure: Left Heart Cath and Coronary Angiography;  Surgeon: Dalia HeadingKenneth A Fath, MD;  Location: [facility] INVASIVE CV LAB;  Service: Cardiovascular;  Laterality: N/A;  . none      Prior to Admission medications   Medication Sig Start Date End Date Taking? Authorizing Provider  aspirin EC 81 MG EC tablet Take 1 tablet (81 mg total) by mouth daily. 11/09/15   Adrian SaranSital Mody, MD  atorvastatin (LIPITOR) 80 MG tablet Take 0.5 tablets (40 mg total) by mouth daily at 6 PM. 11/09/15   Adrian SaranSital Mody, MD  citalopram (CELEXA) 20 MG tablet Take 20 mg by mouth daily.    Historical Provider, MD  clopidogrel (PLAVIX) 75 MG tablet Take 1 tablet (75 mg total) by mouth daily. 11/09/15   Adrian SaranSital Mody, MD  glipiZIDE (GLUCOTROL) 5 MG tablet Take 0.5 tablets (2.5 mg total) by mouth  daily before breakfast. 11/09/15   Adrian SaranSital Mody, MD  Lancets (ACCU-CHEK MULTICLIX) lancets Use as instructed 11/09/15   Adrian SaranSital Mody, MD  levofloxacin (LEVAQUIN) 500 MG tablet Take 1 tablet (500 mg total) by mouth daily. 11/09/15   Adrian SaranSital Mody, MD  lisinopril (PRINIVIL,ZESTRIL) 2.5 MG tablet Take 1 tablet (2.5 mg total) by mouth daily. 11/09/15   Adrian SaranSital Mody, MD  metoprolol tartrate (LOPRESSOR) 25 MG tablet Take 1 tablet (25 mg total) by mouth 2 (two) times daily. 11/09/15   Adrian SaranSital Mody, MD  nitroGLYCERIN (NITROSTAT) 0.4 MG SL tablet Place 1 tablet (0.4 mg total) under the tongue every 5 (five) minutes x 3 doses as needed for chest pain. 11/09/15   Adrian SaranSital Mody, MD  selenium 50 MCG TABS tablet Take 200 mcg by mouth daily.    Historical Provider, MD    Allergies  Allergen Reactions  . Azithromycin     Family History  Problem Relation Age of Onset  . Heart attack Mother     deceased  . Heart attack Father     deceased    Social History Social History  Substance Use Topics  . Smoking status: Never Smoker  . Smokeless tobacco: Not on file  . Alcohol use 0.0 oz/week     Comment: one cocktail every day.    Review of Systems  Constitutional: Negative for Recent illness Eyes: Blurry vision left eye. ENT:  Cardiovascular: Negative for chest pain. Respiratory: Negative for  shortness of breath. Gastrointestinal: Negative for vomiting. Genitourinary:  Musculoskeletal: Skin: Negative for rash. Neurological: Negative for headache. 10 point Review of Systems otherwise negative ____________________________________________   PHYSICAL EXAM:  VITAL SIGNS: ED Triage Vitals  Enc Vitals Group     BP 09/20/16 1103 104/79     Pulse Rate 09/20/16 1103 (!) 58     Resp 09/20/16 1103 18     Temp 09/20/16 1103 98.8 F (37.1 C)     Temp Source 09/20/16 1103 Oral     SpO2 09/20/16 1103 100 %     Weight 09/20/16 1104 170 lb (77.1 kg)     Height 09/20/16 1104 5\' 10"  (1.778 m)     Head Circumference --       Peak Flow --      Pain Score 09/20/16 1104 7     Pain Loc --      Pain Edu? --      Excl. in GC? --      Constitutional: Alert and oriented. Keeping his eyes closed due to pain in the left eye. HEENT   Head: Normocephalic and atraumatic.      Eyes: Conjunctivae are normal. PERRL. Normal extraocular movements.  To my bare eye, question corneal abrasion left vs. Mucus/epithelium      Ears:         Nose: No congestion/rhinnorhea.   Mouth/Throat: Mucous membranes are moist.   Neck: No stridor. Cardiovascular/Chest: Normal color to fingertips and lips. Respiratory: Normal respiratory effort without tachypnea nor retractions. Gastrointestinal: Deferred Genitourinary/rectal:Deferred Musculoskeletal: Normal movement of 4 extremities. Neurologic:  Normal speech and language. No gross or focal neurologic deficits are appreciated. Skin:  Skin is warm, dry and intact. No rash noted. Psychiatric: Mood and affect are normal. Speech and behavior are normal. Patient exhibits appropriate insight and judgment.   ____________________________________________  LABS (pertinent positives/negatives)  Labs Reviewed - No data to display  ____________________________________________    EKG I, Governor Rooks, MD, the attending physician have personally viewed and interpreted all ECGs.  None ____________________________________________  RADIOLOGY All Xrays were viewed by me. Imaging interpreted by Radiologist.  None __________________________________________  PROCEDURES  Procedure(s) performed: None  Critical Care performed: None  ____________________________________________   ED COURSE / ASSESSMENT AND PLAN  Pertinent labs & imaging results that were available during my care of the patient were reviewed by me and considered in my medical decision making (see chart for details).   After quick exam with this patient who appears to be in significant pain out of the left eye  after I procedure yesterday, but no obvious redness or pupillary abnormality, I spoke with Dr. Inez Pilgrim was only a few minutes with the hospitalist who will see this patient here in the eye exam room.   Dr. Inez Pilgrim requested the patient be discharged from the emergency department and he will actually made him over at [facility] for further treatment.    CONSULTATIONS:   Dr. Inez Pilgrim, ophthalmology saw patient in the ER.   Patient / Family / Caregiver informed of clinical course, medical decision-making process, and agree with plan.   I discussed return precautions, follow-up instructions, and discharge instructions with patient and/or family.   ___________________________________________   FINAL CLINICAL IMPRESSION(S) / ED DIAGNOSES   Final diagnoses:  Left eye pain   Post procedure pain           Note: This dictation was prepared with Dragon dictation. Any transcriptional errors that result from this process are unintentional  Governor Rooks, MD 09/20/16 1201

## 2016-10-18 ENCOUNTER — Emergency Department: Payer: Medicare Other

## 2016-10-18 ENCOUNTER — Emergency Department
Admission: EM | Admit: 2016-10-18 | Discharge: 2016-10-18 | Disposition: A | Discharge status: Home / Self Care | Payer: Medicare Other | Attending: Emergency Medicine | Admitting: Emergency Medicine

## 2016-10-18 ENCOUNTER — Encounter: Payer: Self-pay | Admitting: Emergency Medicine

## 2016-10-18 DIAGNOSIS — M5431 Sciatica, right side: Secondary | ICD-10-CM | POA: Diagnosis not present

## 2016-10-18 DIAGNOSIS — Z7982 Long term (current) use of aspirin: Secondary | ICD-10-CM | POA: Diagnosis not present

## 2016-10-18 DIAGNOSIS — Z87891 Personal history of nicotine dependence: Secondary | ICD-10-CM | POA: Diagnosis not present

## 2016-10-18 DIAGNOSIS — I10 Essential (primary) hypertension: Secondary | ICD-10-CM | POA: Insufficient documentation

## 2016-10-18 DIAGNOSIS — M25551 Pain in right hip: Secondary | ICD-10-CM | POA: Diagnosis present

## 2016-10-18 DIAGNOSIS — Z7984 Long term (current) use of oral hypoglycemic drugs: Secondary | ICD-10-CM | POA: Insufficient documentation

## 2016-10-18 DIAGNOSIS — Z79899 Other long term (current) drug therapy: Secondary | ICD-10-CM | POA: Diagnosis not present

## 2016-10-18 DIAGNOSIS — E119 Type 2 diabetes mellitus without complications: Secondary | ICD-10-CM | POA: Insufficient documentation

## 2016-10-18 MED ORDER — LIDOCAINE 5 % EX PTCH
1.0000 | MEDICATED_PATCH | CUTANEOUS | Status: DC
  Administered 2016-10-18: 1 via TRANSDERMAL
  Filled 2016-10-18: qty 1

## 2016-10-18 MED ORDER — LIDOCAINE 5 % EX PTCH: 1.0000 | MEDICATED_PATCH | CUTANEOUS | 0 refills | Status: DC

## 2016-10-18 NOTE — ED Provider Notes (Signed)
Time Seen: Approximately 704  I have reviewed the triage notes  Chief Complaint: Hip Pain   History of Present Illness: Benjamin Parks is a 83 y.o. male *who states earlier this morning he rolled over on his right sideimmediate onset of some sharp right-sided hip pain points mainly to the right buttock region down into her right leg. He states it did not extend posteriorly below the knee. He denies any leg weakness. He states by the time of my evaluation he is feeling very symptomatically improved and has had some brief episodes where he had sharp pain in the past in a similar location. He denies any difficulty with bladder or bowel function, sensory loss, or any other new concerns   Past Medical History:  Diagnosis Date  . Diabetes mellitus without complication (HCC)   . History of heart attack   . Hyperlipidemia   . Hypertension   . PVC (premature ventricular contraction)   . Right bundle branch block   . Stroke [facility](HCC)     Patient Active Problem List   Diagnosis Date Noted  . Non-STEMI (non-ST elevated myocardial infarction) (HCC) 11/07/2015  . Community acquired pneumonia 11/07/2015  . NSTEMI (non-ST elevated myocardial infarction) (HCC) 11/07/2015    Past Surgical History:  Procedure Laterality Date  . APPENDECTOMY    . CARDIAC CATHETERIZATION N/A 11/08/2015   Procedure: Left Heart Cath and Coronary Angiography;  Surgeon: Dalia HeadingKenneth A Fath, MD;  Location: [facility] INVASIVE CV LAB;  Service: Cardiovascular;  Laterality: N/A;  . HERNIA REPAIR    . none    . TONSILLECTOMY      Past Surgical History:  Procedure Laterality Date  . APPENDECTOMY    . CARDIAC CATHETERIZATION N/A 11/08/2015   Procedure: Left Heart Cath and Coronary Angiography;  Surgeon: Dalia HeadingKenneth A Fath, MD;  Location: [facility] INVASIVE CV LAB;  Service: Cardiovascular;  Laterality: N/A;  . HERNIA REPAIR    . none    . TONSILLECTOMY      Current Outpatient Rx  . Order #: [id number] Class: Normal  . Order #:  [id number] Class: Normal  . Order #: [id number] Class: Historical Med  . Order #: [id number] Class: Normal  . Order #: [id number] Class: Normal  . Order #: [id number] Class: Normal  . Order #: [id number] Class: Normal  . Order #: [id number] Class: Print  . Order #: [id number] Class: Normal  . Order #: [id number] Class: Normal  . Order #: [id number] Class: Normal  . Order #: [id number] Class: Historical Med    Allergies:  Azithromycin  Family History: Family History  Problem Relation Age of Onset  . Heart attack Mother     deceased  . Heart attack Father     deceased    Social History: Social History  Substance Use Topics  . Smoking status: Former Games developermoker  . Smokeless tobacco: Never Used  . Alcohol use 0.0 oz/week     Comment: one cocktail every day.     Review of Systems:   10 point review of systems was performed and was otherwise negative:  Constitutional: No fever Eyes: No visual disturbances ENT: No sore throat, ear pain Cardiac: No chest pain Respiratory: No shortness of breath, wheezing, or stridor Abdomen: No abdominal pain, no vomiting, No diarrhea Endocrine: No weight loss, No night sweats Extremities: No peripheral edema, cyanosis Skin: No rashes, easy bruising Neurologic: No focal weakness, trouble with speech or swollowing Urologic: No dysuria, Hematuria, or urinary frequency   Physical Exam:  ED Triage Vitals [10/18/16 0605]  Enc Vitals Group     BP Marland Kitchen(!)  169/81     Pulse Rate (!) 58     Resp 18     Temp 97.5 F (36.4 C)     Temp Source Oral     SpO2 99 %     Weight 170 lb (77.1 kg)     Height 5\' 10"  (1.778 m)     Head Circumference      Peak Flow      Pain Score 6     Pain Loc      Pain Edu?      Excl. in GC?     General: Awake , Alert , and Oriented times 3; GCS 15 Appears much younger than stated age Head: Normal cephalic , atraumatic Eyes: Pupils equal , round, reactive to light Nose/Throat: No nasal drainage, patent upper airway without erythema  or exudate.  Neck: Supple, Full range of motion, No anterior adenopathy or palpable thyroid masses Lungs: Clear to ascultation without wheezes , rhonchi, or rales Heart: Regular rate, regular rhythm without murmurs , gallops , or rubs Abdomen: Soft, non tender without rebound, guarding , or rigidity; bowel sounds positive and symmetric in all 4 quadrants. No organomegaly .        Extremities: No reproducible pain in her right hip with rotation and flexion and extension. Able to ambulate without significant discomfort with weightbearing. No signs of erythema or warmth around the joint Neurologic: normal ambulation, Motor symmetric without deficits, sensory intact Skin: warm, dry, no rashes    Radiology:  "Dg Hip Unilat With Pelvis 2-3 Views Right  Result Date: 10/18/2016 CLINICAL DATA:  83 year old with right hip pain since last night. No known injury. EXAM: DG HIP (WITH OR WITHOUT PELVIS) 2-3V RIGHT COMPARISON:  None. FINDINGS: No evidence of acute fracture or subluxation. Cortical margins appear intact. Bilateral hip osteoarthritis, normal for age. Acetabular and femoral head spurring noted on the right. Pubic rami are intact. Pubic symphysis sacroiliac joints remain congruent. No evidence of focal lesion. Sutures from prior hernia repair project over the right groin. IMPRESSION: No evidence of acute osseous abnormality of the pelvis or right hip. Mild osteoarthritis. Electronically Signed   By: Rubye Oaks M.D.   On: 10/18/2016 06:42  "  I personally reviewed the radiologic studies     ED Course:  Patient was able to demonstrate ambulation and had a lidocaine patch applied to the right hip region which he tolerated well. No signs of cauda equina syndrome. No leg weakness and normal bowel and bladder function.     Assessment: * Right-sided sciatica   Final Clinical Impression:   Final diagnoses:  Sciatica of right side     Plan:  Outpatient " New Prescriptions    LIDOCAINE (LIDODERM) 5 %    Place 1 patch onto the skin daily.  " Patient was advised to return immediately if condition worsens. Patient was advised to follow up with their primary care physician or other specialized physicians involved in their outpatient care. The patient and/or family member/power of attorney had laboratory results reviewed at the bedside. All questions and concerns were addressed and appropriate discharge instructions were distributed by the nursing staff.            Jennye Moccasin, MD 10/18/16 [phone]

## 2016-10-18 NOTE — ED Triage Notes (Signed)
Pt arrives via [facility] with c/o right hip pain that radiates down his right leg. Pt states that he has had similar pains like this before when he was diagnosed with sciatica x10 years. Pt is alert and oriented at this time with NAD noted.

## 2016-10-18 NOTE — ED Notes (Signed)
This RN to bedside at this time. Pt visualized in NAD, this RN introduced self to patient and family at this time. Lidocaine patch applied to patient's R hip per MD instructions. This RN also ambulated patient, MD visualized. Pt is ambulatory without any difficulty at this time. Pt is noted to be alert and oriented, pleasant, and cooperative.

## 2016-10-18 NOTE — ED Notes (Signed)
NAD noted at time of D/C. Pt taken to the lobby via wheelchair by this RN. Pt denies any comments/concerns at this time. MD aware of patient's BP at time of D/C.

## 2016-10-18 NOTE — Discharge Instructions (Signed)
Please return emergency department if he develops leg weakness, fever, difficulty with urinary or bowel function, sensory loss or any other new concerns. He can take over-the-counter medications such as Tylenol for pain. Please contact your primary physician for possible outpatient MRI evaluation.  Please return immediately if condition worsens. Please contact her primary physician or the physician you were given for referral. If you have any specialist physicians involved in her treatment and plan please also contact them. Thank you for using [place] [facility].

## 2017-01-01 DIAGNOSIS — R319 Hematuria, unspecified: Secondary | ICD-10-CM | POA: Insufficient documentation

## 2017-01-01 DIAGNOSIS — N138 Other obstructive and reflux uropathy: Secondary | ICD-10-CM | POA: Insufficient documentation

## 2017-01-01 DIAGNOSIS — N401 Enlarged prostate with lower urinary tract symptoms: Secondary | ICD-10-CM

## 2017-01-03 ENCOUNTER — Emergency Department
Admission: EM | Admit: 2017-01-03 | Discharge: 2017-01-03 | Disposition: A | Discharge status: Home / Self Care | Payer: Medicare Other | Attending: Emergency Medicine | Admitting: Emergency Medicine

## 2017-01-03 ENCOUNTER — Encounter: Payer: Self-pay | Admitting: Emergency Medicine

## 2017-01-03 DIAGNOSIS — Y733 Surgical instruments, materials and gastroenterology and urology devices (including sutures) associated with adverse incidents: Secondary | ICD-10-CM | POA: Insufficient documentation

## 2017-01-03 DIAGNOSIS — Z87891 Personal history of nicotine dependence: Secondary | ICD-10-CM | POA: Diagnosis not present

## 2017-01-03 DIAGNOSIS — E119 Type 2 diabetes mellitus without complications: Secondary | ICD-10-CM | POA: Insufficient documentation

## 2017-01-03 DIAGNOSIS — T83511A Infection and inflammatory reaction due to indwelling urethral catheter, initial encounter: Secondary | ICD-10-CM | POA: Diagnosis not present

## 2017-01-03 DIAGNOSIS — Z7982 Long term (current) use of aspirin: Secondary | ICD-10-CM | POA: Insufficient documentation

## 2017-01-03 DIAGNOSIS — I10 Essential (primary) hypertension: Secondary | ICD-10-CM | POA: Diagnosis not present

## 2017-01-03 DIAGNOSIS — N390 Urinary tract infection, site not specified: Secondary | ICD-10-CM

## 2017-01-03 DIAGNOSIS — T83011A Breakdown (mechanical) of indwelling urethral catheter, initial encounter: Secondary | ICD-10-CM

## 2017-01-03 DIAGNOSIS — Z79899 Other long term (current) drug therapy: Secondary | ICD-10-CM | POA: Insufficient documentation

## 2017-01-03 DIAGNOSIS — Z7984 Long term (current) use of oral hypoglycemic drugs: Secondary | ICD-10-CM | POA: Insufficient documentation

## 2017-01-03 LAB — URINALYSIS, COMPLETE (UACMP) WITH MICROSCOPIC
Specific Gravity, Urine: 1.022 (ref 1.005–1.030)
Squamous Epithelial / LPF: NONE SEEN

## 2017-01-03 MED ORDER — CIPROFLOXACIN HCL 500 MG PO TABS
500.0000 mg | ORAL_TABLET | Freq: Once | ORAL | Status: AC
  Administered 2017-01-03: 500 mg via ORAL
  Filled 2017-01-03: qty 1

## 2017-01-03 MED ORDER — CIPROFLOXACIN HCL 500 MG PO TABS: 500.0000 mg | ORAL_TABLET | Freq: Two times a day (BID) | ORAL | 0 refills | Status: AC

## 2017-01-03 NOTE — ED Triage Notes (Signed)
States had prostate surgery 2 days ago. States yesterday noted urinary drainage around catheter at urinary meatus. Patient arrives with leg bag in hand and states catheter, which is not visible under clothes at this time. Is not connected to any tubing. States that he disconnected it some time this afternoon.

## 2017-01-03 NOTE — ED Notes (Signed)
RN attached new leg bag

## 2017-01-03 NOTE — ED Provider Notes (Signed)
[facility] Emergency Department Provider Note  ____________________________________________   First MD Initiated Contact with Patient 01/03/17 1704     (approximate)  I have reviewed the triage vital signs and the nursing notes.   HISTORY  Chief Complaint Post-op Problem    HPI Joseandres Mazer is a 83 y.o. male who comes to the emergency department because he is urinating from his penis around his Foley catheter and not actually out from the Foley catheter. His urologist replace his Foley catheter 3 days ago and he has it in place following a prostate surgery. He denies fevers or chills. He denies chest pain or shortness of breath. He does have mild nonradiating suprapubic abdominal fullness.   Past Medical History:  Diagnosis Date  . Diabetes mellitus without complication (HCC)   . History of heart attack   . Hyperlipidemia   . Hypertension   . PVC (premature ventricular contraction)   . Right bundle branch block   . Stroke [facility])     Patient Active Problem List   Diagnosis Date Noted  . Non-STEMI (non-ST elevated myocardial infarction) (HCC) 11/07/2015  . Community acquired pneumonia 11/07/2015  . NSTEMI (non-ST elevated myocardial infarction) (HCC) 11/07/2015    Past Surgical History:  Procedure Laterality Date  . APPENDECTOMY    . CARDIAC CATHETERIZATION N/A 11/08/2015   Procedure: Left Heart Cath and Coronary Angiography;  Surgeon: Dalia Heading, MD;  Location: [facility] INVASIVE CV LAB;  Service: Cardiovascular;  Laterality: N/A;  . HERNIA REPAIR    . none    . TONSILLECTOMY      Prior to Admission medications   Medication Sig Start Date End Date Taking? Authorizing Provider  aspirin EC 81 MG EC tablet Take 1 tablet (81 mg total) by mouth daily. 11/09/15   Adrian Saran, MD  atorvastatin (LIPITOR) 80 MG tablet Take 0.5 tablets (40 mg total) by mouth daily at 6 PM. 11/09/15   Adrian Saran, MD  ciprofloxacin (CIPRO) 500 MG tablet Take 1 tablet  (500 mg total) by mouth 2 (two) times daily. 01/03/17 01/13/17  Merrily Brittle, MD  citalopram (CELEXA) 20 MG tablet Take 20 mg by mouth daily.    Historical Provider, MD  clopidogrel (PLAVIX) 75 MG tablet Take 1 tablet (75 mg total) by mouth daily. 11/09/15   Adrian Saran, MD  glipiZIDE (GLUCOTROL) 5 MG tablet Take 0.5 tablets (2.5 mg total) by mouth daily before breakfast. 11/09/15   Adrian Saran, MD  Lancets (ACCU-CHEK MULTICLIX) lancets Use as instructed 11/09/15   Adrian Saran, MD  levofloxacin (LEVAQUIN) 500 MG tablet Take 1 tablet (500 mg total) by mouth daily. 11/09/15   Adrian Saran, MD  lidocaine (LIDODERM) 5 % Place 1 patch onto the skin daily. 10/18/16   Jennye Moccasin, MD  lisinopril (PRINIVIL,ZESTRIL) 2.5 MG tablet Take 1 tablet (2.5 mg total) by mouth daily. 11/09/15   Adrian Saran, MD  metoprolol tartrate (LOPRESSOR) 25 MG tablet Take 1 tablet (25 mg total) by mouth 2 (two) times daily. 11/09/15   Adrian Saran, MD  nitroGLYCERIN (NITROSTAT) 0.4 MG SL tablet Place 1 tablet (0.4 mg total) under the tongue every 5 (five) minutes x 3 doses as needed for chest pain. 11/09/15   Adrian Saran, MD  selenium 50 MCG TABS tablet Take 200 mcg by mouth daily.    Historical Provider, MD    Allergies Azithromycin and Metformin and related  Family History  Problem Relation Age of Onset  . Heart attack Mother  deceased  . Heart attack Father     deceased    Social History Social History  Substance Use Topics  . Smoking status: Former Games developer  . Smokeless tobacco: Never Used  . Alcohol use 0.0 oz/week     Comment: one cocktail every day.    Review of Systems Constitutional: No fever/chills Eyes: No visual changes. ENT: No sore throat. Cardiovascular: Denies chest pain. Respiratory: Denies shortness of breath. Gastrointestinal: + abdominal pain.  + nausea, no vomiting.  No diarrhea.  No constipation. Genitourinary: Negative for dysuria. Musculoskeletal: Negative for back pain. Skin: Negative for  rash. Neurological: Negative for headaches, focal weakness or numbness.  10-point ROS otherwise negative.  ____________________________________________   PHYSICAL EXAM:  VITAL SIGNS: ED Triage Vitals  Enc Vitals Group     BP 01/03/17 1632 138/61     Pulse Rate 01/03/17 1632 72     Resp 01/03/17 1632 18     Temp 01/03/17 1632 98.7 F (37.1 C)     Temp Source 01/03/17 1632 Oral     SpO2 01/03/17 1632 96 %     Weight 01/03/17 1634 170 lb (77.1 kg)     Height 01/03/17 1634  (1.778 m)     Head Circumference --      Peak Flow --      Pain Score 01/03/17 1632 3     Pain Loc --      Pain Edu? --      Excl. in GC? --     Constitutional: Alert and oriented x 4 well appearing nontoxic no diaphoresis speaks in full, clear sentences Eyes: PERRL EOMI. Head: Atraumatic. Nose: No congestion/rhinnorhea. Mouth/Throat: No trismus Neck: No stridor.   Cardiovascular: Normal rate, regular rhythm. Grossly normal heart sounds.  Good peripheral circulation. Respiratory: Normal respiratory effort.  No retractions. Lungs CTAB and moving good air Gastrointestinal: Soft nondistended nontender no rebound no guarding no peritonitis  Foley in place, leaking urine around glans Musculoskeletal: No lower extremity edema   Neurologic:  Normal speech and language. No gross focal neurologic deficits are appreciated. Skin:  Skin is warm, dry and intact. No rash noted. Psychiatric: Mood and affect are normal. Speech and behavior are normal.    ____________________________________________   ____________________________________________   LABS (all labs ordered are listed, but only abnormal results are displayed)  Labs Reviewed  URINALYSIS, COMPLETE (UACMP) WITH MICROSCOPIC - Abnormal; Notable for the following:       Result Value   Color, Urine BROWN (*)    APPearance TURBID (*)    Glucose, UA   (*)    Value: TEST NOT REPORTED DUE TO COLOR INTERFERENCE OF URINE PIGMENT   Hgb urine dipstick    (*)    Value: TEST NOT REPORTED DUE TO COLOR INTERFERENCE OF URINE PIGMENT   Bilirubin Urine   (*)    Value: TEST NOT REPORTED DUE TO COLOR INTERFERENCE OF URINE PIGMENT   Ketones, ur   (*)    Value: TEST NOT REPORTED DUE TO COLOR INTERFERENCE OF URINE PIGMENT   Protein, ur   (*)    Value: TEST NOT REPORTED DUE TO COLOR INTERFERENCE OF URINE PIGMENT   Nitrite   (*)    Value: TEST NOT REPORTED DUE TO COLOR INTERFERENCE OF URINE PIGMENT   Leukocytes, UA   (*)    Value: TEST NOT REPORTED DUE TO COLOR INTERFERENCE OF URINE PIGMENT   Bacteria, UA MANY (*)    All other components within normal limits  URINE CULTURE    c/w infection __________________________________________  EKG   ____________________________________________  RADIOLOGY   ____________________________________________   PROCEDURES  Procedure(s) performed: no  Procedures  Critical Care performed: no  ____________________________________________   INITIAL IMPRESSION / ASSESSMENT AND PLAN / ED COURSE  Pertinent labs & imaging results that were available during my care of the patient were reviewed by me and considered in my medical decision making (see chart for details).  On arrival at performed a bedside ultrasound which confirmed that the patient's Foley balloon was in his bladder and it was largely decompressed. We flushed his Foley and a clot dislodged and it began to immediately drained. Urinalysis and culture are pending.     ----------------------------------------- 6:52 PM on 01/03/2017 -----------------------------------------  I discussed the case with Dr. Rolley Sims the chief resident on-call at [facility] for Dr. Logan Bores who agrees with ciprofloxacin as well as removing the Foley today and replacing it. ____________________________________________   FINAL CLINICAL IMPRESSION(S) / ED DIAGNOSES  Final diagnoses:  Urinary tract infection associated with indwelling urethral  catheter, initial encounter (HCC)  Malfunction of Foley catheter, initial encounter (HCC)      NEW MEDICATIONS STARTED DURING THIS VISIT:  New Prescriptions   CIPROFLOXACIN (CIPRO) 500 MG TABLET    Take 1 tablet (500 mg total) by mouth 2 (two) times daily.     Note:  This document was prepared using Dragon voice recognition software and may include unintentional dictation errors.     Merrily Brittle, MD 01/03/17 2002

## 2017-01-03 NOTE — ED Notes (Signed)
Pt. Going home with family. 

## 2017-01-03 NOTE — ED Notes (Signed)
Removed old urinary catheter and replaced with new 16 fr. Urinary catheter and leg bag.  Dependant bag also given to pt. To take home.  Instructions given on maintenance and use.

## 2017-01-03 NOTE — Discharge Instructions (Signed)
DO NOT TAKE YOUR GLIPIZIDE WHILE YOU ARE TAKING THESE ANTIBIOTICS.  IT CAN CAUSE A DANGEROUS DROP IN YOUR BLOOD SUGAR.  Please keep her follow-up appointment with your urologist this coming Thursday as scheduled and return to the emergency department for any concerns.  It was a pleasure to take care of you today, and thank you for coming to our emergency department.  If you have any questions or concerns before leaving please ask the nurse to grab me and I'm more than happy to go through your aftercare instructions again.  If you were prescribed any opioid pain medication today such as Norco, Vicodin, Percocet, morphine, hydrocodone, or oxycodone please make sure you do not drive when you are taking this medication as it can alter your ability to drive safely.  If you have any concerns once you are home that you are not improving or are in fact getting worse before you can make it to your follow-up appointment, please do not hesitate to call 911 and come back for further evaluation.  Merrily Brittle MD  Results for orders placed or performed during the hospital encounter of 01/03/17  Urinalysis, Complete w Microscopic  Result Value Ref Range   Color, Urine BROWN (A) YELLOW   APPearance TURBID (A) CLEAR   Specific Gravity, Urine 1.022 1.005 - 1.030   pH  5.0 - 8.0    TEST NOT REPORTED DUE TO COLOR INTERFERENCE OF URINE PIGMENT   Glucose, UA (A) NEGATIVE mg/dL    TEST NOT REPORTED DUE TO COLOR INTERFERENCE OF URINE PIGMENT   Hgb urine dipstick (A) NEGATIVE    TEST NOT REPORTED DUE TO COLOR INTERFERENCE OF URINE PIGMENT   Bilirubin Urine (A) NEGATIVE    TEST NOT REPORTED DUE TO COLOR INTERFERENCE OF URINE PIGMENT   Ketones, ur (A) NEGATIVE mg/dL    TEST NOT REPORTED DUE TO COLOR INTERFERENCE OF URINE PIGMENT   Protein, ur (A) NEGATIVE mg/dL    TEST NOT REPORTED DUE TO COLOR INTERFERENCE OF URINE PIGMENT   Nitrite (A) NEGATIVE    TEST NOT REPORTED DUE TO COLOR INTERFERENCE OF URINE PIGMENT     Leukocytes, UA (A) NEGATIVE    TEST NOT REPORTED DUE TO COLOR INTERFERENCE OF URINE PIGMENT   RBC / HPF TOO NUMEROUS TO COUNT 0 - 5 RBC/hpf   WBC, UA TOO NUMEROUS TO COUNT 0 - 5 WBC/hpf   Bacteria, UA MANY (A) NONE SEEN   Squamous Epithelial / LPF NONE SEEN NONE SEEN   WBC Clumps PRESENT    Amorphous Crystal PRESENT    No results found.

## 2017-01-03 NOTE — ED Notes (Addendum)
MD Rafenbark at bedside with ultrasound to confirm catheter placement. After confirmation RN flushed catheter and urine collected.

## 2017-01-04 ENCOUNTER — Emergency Department
Admission: EM | Admit: 2017-01-04 | Discharge: 2017-01-04 | Disposition: A | Discharge status: Home / Self Care | Payer: Medicare Other | Attending: Emergency Medicine | Admitting: Emergency Medicine

## 2017-01-04 DIAGNOSIS — Z7984 Long term (current) use of oral hypoglycemic drugs: Secondary | ICD-10-CM | POA: Diagnosis not present

## 2017-01-04 DIAGNOSIS — Z79899 Other long term (current) drug therapy: Secondary | ICD-10-CM | POA: Diagnosis not present

## 2017-01-04 DIAGNOSIS — Z87891 Personal history of nicotine dependence: Secondary | ICD-10-CM | POA: Diagnosis not present

## 2017-01-04 DIAGNOSIS — T83031A Leakage of indwelling urethral catheter, initial encounter: Secondary | ICD-10-CM | POA: Insufficient documentation

## 2017-01-04 DIAGNOSIS — E119 Type 2 diabetes mellitus without complications: Secondary | ICD-10-CM | POA: Insufficient documentation

## 2017-01-04 DIAGNOSIS — Z7982 Long term (current) use of aspirin: Secondary | ICD-10-CM | POA: Diagnosis not present

## 2017-01-04 DIAGNOSIS — Y733 Surgical instruments, materials and gastroenterology and urology devices (including sutures) associated with adverse incidents: Secondary | ICD-10-CM | POA: Insufficient documentation

## 2017-01-04 DIAGNOSIS — I10 Essential (primary) hypertension: Secondary | ICD-10-CM | POA: Diagnosis not present

## 2017-01-04 DIAGNOSIS — T839XXA Unspecified complication of genitourinary prosthetic device, implant and graft, initial encounter: Secondary | ICD-10-CM

## 2017-01-04 MED ORDER — LIDOCAINE HCL 2 % EX GEL
CUTANEOUS | Status: AC
Start: 2017-01-04 — End: 2017-01-04
  Administered 2017-01-04: 10:00:00
  Filled 2017-01-04: qty 10

## 2017-01-04 NOTE — ED Provider Notes (Signed)
[facility] Emergency Department Provider Note   ____________________________________________   First MD Initiated Contact with Patient 01/04/17 0940     (approximate)  I have reviewed the triage vital signs and the nursing notes.   HISTORY  Chief Complaint Urinary Tract Infection   HPI Benjamin Parks is a 83 y.o. male patient seen yesterday for blocked catheter and started on Cipro for UTI. Today the catheter is leaking at the penis.patient has no other complaints no fever chills nausea vomiting abdominal pain or anything else.catheters in place following prostate surgery.  Past Medical History:  Diagnosis Date  . Diabetes mellitus without complication (HCC)   . History of heart attack   . Hyperlipidemia   . Hypertension   . PVC (premature ventricular contraction)   . Right bundle branch block   . Stroke [facility])     Patient Active Problem List   Diagnosis Date Noted  . Non-STEMI (non-ST elevated myocardial infarction) (HCC) 11/07/2015  . Community acquired pneumonia 11/07/2015  . NSTEMI (non-ST elevated myocardial infarction) (HCC) 11/07/2015    Past Surgical History:  Procedure Laterality Date  . APPENDECTOMY    . CARDIAC CATHETERIZATION N/A 11/08/2015   Procedure: Left Heart Cath and Coronary Angiography;  Surgeon: Dalia Heading, MD;  Location: [facility] INVASIVE CV LAB;  Service: Cardiovascular;  Laterality: N/A;  . HERNIA REPAIR    . none    . TONSILLECTOMY      Prior to Admission medications   Medication Sig Start Date End Date Taking? Authorizing Provider  aspirin EC 81 MG EC tablet Take 1 tablet (81 mg total) by mouth daily. 11/09/15   Adrian Saran, MD  atorvastatin (LIPITOR) 80 MG tablet Take 0.5 tablets (40 mg total) by mouth daily at 6 PM. 11/09/15   Adrian Saran, MD  ciprofloxacin (CIPRO) 500 MG tablet Take 1 tablet (500 mg total) by mouth 2 (two) times daily. 01/03/17 01/13/17  Merrily Brittle, MD  citalopram (CELEXA) 20 MG tablet Take 20 mg  by mouth daily.    Historical Provider, MD  clopidogrel (PLAVIX) 75 MG tablet Take 1 tablet (75 mg total) by mouth daily. 11/09/15   Adrian Saran, MD  glipiZIDE (GLUCOTROL) 5 MG tablet Take 0.5 tablets (2.5 mg total) by mouth daily before breakfast. 11/09/15   Adrian Saran, MD  Lancets (ACCU-CHEK MULTICLIX) lancets Use as instructed 11/09/15   Adrian Saran, MD  levofloxacin (LEVAQUIN) 500 MG tablet Take 1 tablet (500 mg total) by mouth daily. 11/09/15   Adrian Saran, MD  lidocaine (LIDODERM) 5 % Place 1 patch onto the skin daily. 10/18/16   Jennye Moccasin, MD  lisinopril (PRINIVIL,ZESTRIL) 2.5 MG tablet Take 1 tablet (2.5 mg total) by mouth daily. 11/09/15   Adrian Saran, MD  metoprolol tartrate (LOPRESSOR) 25 MG tablet Take 1 tablet (25 mg total) by mouth 2 (two) times daily. 11/09/15   Adrian Saran, MD  nitroGLYCERIN (NITROSTAT) 0.4 MG SL tablet Place 1 tablet (0.4 mg total) under the tongue every 5 (five) minutes x 3 doses as needed for chest pain. 11/09/15   Adrian Saran, MD  selenium 50 MCG TABS tablet Take 200 mcg by mouth daily.    Historical Provider, MD    Allergies Azithromycin and Metformin and related  Family History  Problem Relation Age of Onset  . Heart attack Mother     deceased  . Heart attack Father     deceased    Social History Social History  Substance Use Topics  .  Smoking status: Former Games developer  . Smokeless tobacco: Never Used  . Alcohol use 0.0 oz/week     Comment: one cocktail every day.    Review of Systems Constitutional: No fever/chills Eyes: No visual changes. ENT: No sore throat. Cardiovascular: Denies chest pain. Respiratory: Denies shortness of breath. Gastrointestinal: No abdominal pain.  No nausea, no vomiting.  No diarrhea.  No constipation. Genitourinary: Negative for dysuria. Musculoskeletal: Negative for back pain. Skin: Negative for rash.  10-point ROS otherwise negative.  ____________________________________________   PHYSICAL EXAM:  VITAL SIGNS: ED  Triage Vitals  Enc Vitals Group     BP 01/04/17 0855 (!) 140/50     Pulse Rate 01/04/17 0855 68     Resp 01/04/17 0855 18     Temp 01/04/17 0855 97.9 F (36.6 C)     Temp Source 01/04/17 0855 Oral     SpO2 01/04/17 0855 98 %     Weight 01/04/17 0855 170 lb (77.1 kg)     Height 01/04/17 0855  (1.778 m)     Head Circumference --      Peak Flow --      Pain Score 01/04/17 0931 0     Pain Loc --      Pain Edu? --      Excl. in GC? --     Constitutional: Alert and oriented. Well appearing and in no acute distress. Eyes: Conjunctivae are normal. PERRL. EOMI. Head: Atraumatic. Nose: No congestion/rhinnorhea. Mouth/Throat: Mucous membranes are moist.  Oropharynx non-erythematous. Neck: No stridor.   Gastrointestinal: Soft and nontender. No distention. No abdominal bruits. No CVA tenderness. Genitourinary: uncircumcised male with Foley catheter in place there is some dampness around the end of the c though it is leaking as Musculoskeletal: No lower extremity tenderness nor edema.  No joint effusions. Neurologic:  Normal speech and language. No gross focal neurologic deficits are appreciated. No gait instability. Skin:  Skin is warm, dry and intact. No rash noted. Psychiatric: Mood and affect are normal. Speech and behavior are normal.  ____________________________________________   LABS (all labs ordered are listed, but only abnormal results are displayed)  Labs Reviewed - No data to display ____________________________________________  EKG   ____________________________________________  RADIOLOGY   ____________________________________________   PROCEDURES  Procedure(s) performed:   Procedures  Critical Care performed:   ____________________________________________   INITIAL IMPRESSION / ASSESSMENT AND PLAN / ED COURSE  Pertinent labs & imaging results that were available during my care of the patient were reviewed by me and considered in my medical  decision making (see chart for details).  Foley replaced does noaking any further will be discharged patient      ____________________________________________   FINAL CLINICAL IMPRESSION(S) / ED DIAGNOSES  Final diagnoses:  Foley catheter problem, initial encounter [facility])      NEW MEDICATIONS STARTED DURING THIS VISIT:  New Prescriptions   No medications on file     Note:  This document was prepared using Dragon voice recognition software and may include unintentional dictation errors.    Arnaldo Natal, MD 01/04/17 1055

## 2017-01-04 NOTE — ED Notes (Signed)
Pt verbalized understanding of discharge instructions. NAD at this time. 

## 2017-01-04 NOTE — ED Notes (Signed)
Catheter collection unit changed to leg bag.

## 2017-01-04 NOTE — ED Notes (Signed)
Pt seen here yesterday for urinary catheter blockage. Catheter was replaced prior to discharge pt is now having leakage around the catheter.

## 2017-01-04 NOTE — ED Notes (Signed)
Old 16 fr urinary catheter removed and replaced with a 20 fr catheter.

## 2017-01-04 NOTE — ED Triage Notes (Signed)
Pt c/o leaking foley catheter. Pt reports being seen yesterday with same issue and foley changes and d/c. Report started on cipro for UTI as well. Leakage around insertion site starting last PM.

## 2017-01-04 NOTE — Discharge Instructions (Signed)
Please follow-up with with your urologist as planned. Please return for any further problems.

## 2017-01-05 LAB — URINE CULTURE: Culture: NO GROWTH

## 2017-01-15 ENCOUNTER — Ambulatory Visit: Payer: Self-pay

## 2017-03-19 ENCOUNTER — Emergency Department
Admission: EM | Admit: 2017-03-19 | Discharge: 2017-03-19 | Disposition: A | Discharge status: Home / Self Care | Payer: Medicare Other | Attending: Emergency Medicine | Admitting: Emergency Medicine

## 2017-03-19 ENCOUNTER — Encounter: Payer: Self-pay | Admitting: Emergency Medicine

## 2017-03-19 DIAGNOSIS — N390 Urinary tract infection, site not specified: Secondary | ICD-10-CM | POA: Diagnosis not present

## 2017-03-19 DIAGNOSIS — E119 Type 2 diabetes mellitus without complications: Secondary | ICD-10-CM | POA: Insufficient documentation

## 2017-03-19 DIAGNOSIS — Z87891 Personal history of nicotine dependence: Secondary | ICD-10-CM | POA: Diagnosis not present

## 2017-03-19 DIAGNOSIS — Z79899 Other long term (current) drug therapy: Secondary | ICD-10-CM | POA: Insufficient documentation

## 2017-03-19 DIAGNOSIS — Z7982 Long term (current) use of aspirin: Secondary | ICD-10-CM | POA: Diagnosis not present

## 2017-03-19 DIAGNOSIS — Z7984 Long term (current) use of oral hypoglycemic drugs: Secondary | ICD-10-CM | POA: Diagnosis not present

## 2017-03-19 DIAGNOSIS — Z7902 Long term (current) use of antithrombotics/antiplatelets: Secondary | ICD-10-CM | POA: Diagnosis not present

## 2017-03-19 DIAGNOSIS — R319 Hematuria, unspecified: Secondary | ICD-10-CM | POA: Diagnosis not present

## 2017-03-19 DIAGNOSIS — I10 Essential (primary) hypertension: Secondary | ICD-10-CM | POA: Insufficient documentation

## 2017-03-19 LAB — URINALYSIS, COMPLETE (UACMP) WITH MICROSCOPIC
SQUAMOUS EPITHELIAL / LPF: NONE SEEN
Specific Gravity, Urine: 1.012 (ref 1.005–1.030)

## 2017-03-19 LAB — CBC
HCT: 39.7 % — ABNORMAL LOW (ref 40.0–52.0)
Hemoglobin: 13.3 g/dL (ref 13.0–18.0)
MCH: 29.7 pg (ref 26.0–34.0)
MCHC: 33.6 g/dL (ref 32.0–36.0)
MCV: 88.2 fL (ref 80.0–100.0)
PLATELETS: 193 10*3/uL (ref 150–440)
RBC: 4.5 MIL/uL (ref 4.40–5.90)
RDW: 14.6 % — ABNORMAL HIGH (ref 11.5–14.5)
WBC: 12.7 10*3/uL — AB (ref 3.8–10.6)

## 2017-03-19 LAB — BASIC METABOLIC PANEL
ANION GAP: 9 (ref 5–15)
BUN: 20 mg/dL (ref 6–20)
CO2: 21 mmol/L — ABNORMAL LOW (ref 22–32)
Calcium: 9 mg/dL (ref 8.9–10.3)
Chloride: 104 mmol/L (ref 101–111)
Creatinine, Ser: 1.03 mg/dL (ref 0.61–1.24)
GFR, EST NON AFRICAN AMERICAN: 59 mL/min — AB (ref >60)
Glucose, Bld: 238 mg/dL — ABNORMAL HIGH (ref 65–99)
POTASSIUM: 4.2 mmol/L (ref 3.5–5.1)
SODIUM: 134 mmol/L — AB (ref 135–145)

## 2017-03-19 MED ORDER — CEFDINIR 300 MG PO CAPS: 300.0000 mg | ORAL_CAPSULE | Freq: Two times a day (BID) | ORAL | 0 refills | Status: DC

## 2017-03-19 NOTE — ED Provider Notes (Signed)
[facility] Emergency Department Provider Note  ____________________________________________   First MD Initiated Contact with Patient 03/19/17 1012     (approximate)  I have reviewed the triage vital signs and the nursing notes.   HISTORY  Chief Complaint Hematuria   HPI Benjamin Parks is a 83 y.o. male with a history of BPH as well as UTI was presenting to the emergency department with 1 day of hematuria. He is denying any pain at this time. Says that he was seen earlier today at the [facility] clinic and then sent directly to the emergency department for further evaluation. He said it is similar issue several months ago when he was treated for UTI and then required follow-up with urology. He also had retention at that time and required a Foley catheter. He denies any fever.   Past Medical History:  Diagnosis Date  . Diabetes mellitus without complication (HCC)   . History of heart attack   . Hyperlipidemia   . Hypertension   . PVC (premature ventricular contraction)   . Right bundle branch block   . Stroke [facility])     Patient Active Problem List   Diagnosis Date Noted  . Non-STEMI (non-ST elevated myocardial infarction) (HCC) 11/07/2015  . Community acquired pneumonia 11/07/2015  . NSTEMI (non-ST elevated myocardial infarction) (HCC) 11/07/2015    Past Surgical History:  Procedure Laterality Date  . APPENDECTOMY    . CARDIAC CATHETERIZATION N/A 11/08/2015   Procedure: Left Heart Cath and Coronary Angiography;  Surgeon: Dalia Heading, MD;  Location: [facility] INVASIVE CV LAB;  Service: Cardiovascular;  Laterality: N/A;  . HERNIA REPAIR    . none    . TONSILLECTOMY      Prior to Admission medications   Medication Sig Start Date End Date Taking? Authorizing Provider  atorvastatin (LIPITOR) 80 MG tablet Take 0.5 tablets (40 mg total) by mouth daily at 6 PM. 11/09/15  Yes Mody, Sital, MD  clopidogrel (PLAVIX) 75 MG tablet Take 1 tablet (75 mg total)  by mouth daily. 11/09/15  Yes Mody, Patricia Pesa, MD  cyanocobalamin (,VITAMIN B-12,) 1000 MCG/ML injection Inject 1,000 mcg into the muscle every 30 (thirty) days.   Yes [provider]  diclofenac sodium (VOLTAREN) 1 % GEL Apply 2 g topically 2 (two) times daily as needed for pain. 01/13/17  Yes [provider]  finasteride (PROSCAR) 5 MG tablet Take 5 mg by mouth daily. 02/19/17  Yes [provider]  lisinopril (PRINIVIL,ZESTRIL) 2.5 MG tablet Take 1 tablet (2.5 mg total) by mouth daily. 11/09/15  Yes Adrian Saran, MD  aspirin EC 81 MG EC tablet Take 1 tablet (81 mg total) by mouth daily. 11/09/15   Adrian Saran, MD  citalopram (CELEXA) 20 MG tablet Take 20 mg by mouth daily.    [provider]  glipiZIDE (GLUCOTROL) 5 MG tablet Take 0.5 tablets (2.5 mg total) by mouth daily before breakfast. 11/09/15   Adrian Saran, MD  Lancets (ACCU-CHEK MULTICLIX) lancets Use as instructed 11/09/15   Adrian Saran, MD  levofloxacin (LEVAQUIN) 500 MG tablet Take 1 tablet (500 mg total) by mouth daily. 11/09/15   Adrian Saran, MD  lidocaine (LIDODERM) 5 % Place 1 patch onto the skin daily. 10/18/16   Jennye Moccasin, MD  metoprolol tartrate (LOPRESSOR) 25 MG tablet Take 1 tablet (25 mg total) by mouth 2 (two) times daily. 11/09/15   Adrian Saran, MD  nitroGLYCERIN (NITROSTAT) 0.4 MG SL tablet Place 1 tablet (0.4 mg total) under the  tongue every 5 (five) minutes x 3 doses as needed for chest pain. 11/09/15   Adrian Saran, MD    Allergies Azithromycin and Metformin and related  Family History  Problem Relation Age of Onset  . Heart attack Mother        deceased  . Heart attack Father        deceased    Social History Social History  Substance Use Topics  . Smoking status: Former Games developer  . Smokeless tobacco: Never Used  . Alcohol use 0.0 oz/week     Comment: one cocktail every day.    Review of Systems  Constitutional: No fever/chills Eyes: No visual changes. ENT: No sore  throat. Cardiovascular: Denies chest pain. Respiratory: Denies shortness of breath. Gastrointestinal: No abdominal pain.  No nausea, no vomiting.  No diarrhea.  No constipation. Genitourinary: as above Musculoskeletal: Negative for back pain. Skin: Negative for rash. Neurological: Negative for headaches, focal weakness or numbness.   ____________________________________________   PHYSICAL EXAM:  VITAL SIGNS: ED Triage Vitals  Enc Vitals Group     BP 03/19/17 0949 126/60     Pulse Rate 03/19/17 0949 76     Resp 03/19/17 0949 16     Temp 03/19/17 0949 98 F (36.7 C)     Temp Source 03/19/17 0949 Oral     SpO2 03/19/17 0949 97 %     Weight 03/19/17 0945 170 lb (77.1 kg)     Height 03/19/17 0945 5\' 10"  (1.778 m)     Head Circumference --      Peak Flow --      Pain Score --      Pain Loc --      Pain Edu? --      Excl. in GC? --     Constitutional: Alert and oriented. Well appearing and in no acute distress. Eyes: Conjunctivae are normal.  Head: Atraumatic. Nose: No congestion/rhinnorhea. Mouth/Throat: Mucous membranes are moist.  Neck: No stridor.   Cardiovascular: Normal rate, regular rhythm. Grossly normal heart sounds.  Respiratory: Normal respiratory effort.  No retractions. Lungs CTAB. Gastrointestinal: Soft and nontender. No distention. No CVA tenderness.  Genitourinary:  Patient had urinated in the toilet and was able to visualize it. There were no clots visualized. Urine is grossly red. However, appears to be thin and not her blood.  Musculoskeletal: No lower extremity tenderness nor edema.  No joint effusions. Neurologic:  Normal speech and language. No gross focal neurologic deficits are appreciated. Skin:  Skin is warm, dry and intact. No rash noted. Psychiatric: Mood and affect are normal. Speech and behavior are normal.  ____________________________________________   LABS (all labs ordered are listed, but only abnormal results are displayed)  Labs  Reviewed  URINALYSIS, COMPLETE (UACMP) WITH MICROSCOPIC - Abnormal; Notable for the following:       Result Value   Color, Urine RED (*)    APPearance CLOUDY (*)    Glucose, UA   (*)    Value: TEST NOT REPORTED DUE TO COLOR INTERFERENCE OF URINE PIGMENT   Hgb urine dipstick   (*)    Value: TEST NOT REPORTED DUE TO COLOR INTERFERENCE OF URINE PIGMENT   Bilirubin Urine   (*)    Value: TEST NOT REPORTED DUE TO COLOR INTERFERENCE OF URINE PIGMENT   Ketones, ur   (*)    Value: TEST NOT REPORTED DUE TO COLOR INTERFERENCE OF URINE PIGMENT   Protein, ur   (*)    Value: TEST NOT  REPORTED DUE TO COLOR INTERFERENCE OF URINE PIGMENT   Nitrite   (*)    Value: TEST NOT REPORTED DUE TO COLOR INTERFERENCE OF URINE PIGMENT   Leukocytes, UA   (*)    Value: TEST NOT REPORTED DUE TO COLOR INTERFERENCE OF URINE PIGMENT   Bacteria, UA MANY (*)    All other components within normal limits  CBC - Abnormal; Notable for the following:    WBC 12.7 (*)    HCT 39.7 (*)    RDW 14.6 (*)    All other components within normal limits  BASIC METABOLIC PANEL - Abnormal; Notable for the following:    Sodium 134 (*)    CO2 21 (*)    Glucose, Bld 238 (*)    GFR calc non Af Amer 59 (*)    All other components within normal limits   ____________________________________________  EKG   ____________________________________________  RADIOLOGY  Postvoid residual is undetectable. ____________________________________________   PROCEDURES  Procedure(s) performed:   Procedures  Critical Care performed:   ____________________________________________   INITIAL IMPRESSION / ASSESSMENT AND PLAN / ED COURSE  Pertinent labs & imaging results that were available during my care of the patient were reviewed by me and considered in my medical decision making (see chart for details).  ----------------------------------------- 12:41 PM on 03/19/2017 -----------------------------------------  I will treat the  patient for urinary tract infection. He says that he was treated several months ago and required return visit to the hospital for failure of outpatient antibiotics. 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 He is understanding the plan and willing to comply. He also knows to follow back up with his urologist, Dr. Logan BoresEvans.      ____________________________________________   FINAL CLINICAL IMPRESSION(S) / ED DIAGNOSES  Hematuria. UTI.    NEW MEDICATIONS STARTED DURING THIS VISIT:  New Prescriptions   No medications on file     Note:  This document was prepared using Dragon voice recognition software and may include unintentional dictation errors.     Myrna BlazerSchaevitz, Shalimar Mcclain Matthew, MD 03/19/17 [phone]

## 2017-03-19 NOTE — ED Triage Notes (Addendum)
Pt brought over from [facility].  Pt has had hematuria for 2 days.  Urine brought with pt from sample obtained.  Gross hematuria noted.  Denies any pain.  NAD.  Alert and oriented.

## 2017-03-19 NOTE — ED Notes (Signed)
Pt voided red bloody urine in toilet

## 2017-03-19 NOTE — ED Notes (Signed)
Bladder scan 272 ml post void

## 2018-06-17 DIAGNOSIS — R0602 Shortness of breath: Secondary | ICD-10-CM | POA: Insufficient documentation

## 2018-07-07 ENCOUNTER — Emergency Department
Admission: EM | Admit: 2018-07-07 | Discharge: 2018-07-07 | Disposition: A | Discharge status: Home / Self Care | Payer: Medicare Other | Attending: Emergency Medicine | Admitting: Emergency Medicine

## 2018-07-07 ENCOUNTER — Other Ambulatory Visit: Payer: Self-pay

## 2018-07-07 ENCOUNTER — Emergency Department: Payer: Medicare Other

## 2018-07-07 ENCOUNTER — Encounter: Payer: Self-pay | Admitting: Emergency Medicine

## 2018-07-07 ENCOUNTER — Other Ambulatory Visit (HOSPITAL_COMMUNITY): Payer: Self-pay

## 2018-07-07 DIAGNOSIS — I252 Old myocardial infarction: Secondary | ICD-10-CM | POA: Insufficient documentation

## 2018-07-07 DIAGNOSIS — Z87891 Personal history of nicotine dependence: Secondary | ICD-10-CM | POA: Insufficient documentation

## 2018-07-07 DIAGNOSIS — Z7902 Long term (current) use of antithrombotics/antiplatelets: Secondary | ICD-10-CM | POA: Diagnosis not present

## 2018-07-07 DIAGNOSIS — M5416 Radiculopathy, lumbar region: Secondary | ICD-10-CM | POA: Insufficient documentation

## 2018-07-07 DIAGNOSIS — E119 Type 2 diabetes mellitus without complications: Secondary | ICD-10-CM | POA: Insufficient documentation

## 2018-07-07 DIAGNOSIS — I10 Essential (primary) hypertension: Secondary | ICD-10-CM | POA: Diagnosis not present

## 2018-07-07 DIAGNOSIS — Z8673 Personal history of transient ischemic attack (TIA), and cerebral infarction without residual deficits: Secondary | ICD-10-CM | POA: Insufficient documentation

## 2018-07-07 DIAGNOSIS — Z79899 Other long term (current) drug therapy: Secondary | ICD-10-CM | POA: Diagnosis not present

## 2018-07-07 DIAGNOSIS — Z7982 Long term (current) use of aspirin: Secondary | ICD-10-CM | POA: Insufficient documentation

## 2018-07-07 DIAGNOSIS — R109 Unspecified abdominal pain: Secondary | ICD-10-CM

## 2018-07-07 DIAGNOSIS — R319 Hematuria, unspecified: Secondary | ICD-10-CM | POA: Diagnosis not present

## 2018-07-07 DIAGNOSIS — Z7984 Long term (current) use of oral hypoglycemic drugs: Secondary | ICD-10-CM | POA: Insufficient documentation

## 2018-07-07 LAB — CBC WITH DIFFERENTIAL/PLATELET
Abs Immature Granulocytes: 0.02 10*3/uL (ref 0.00–0.07)
BASOS PCT: 1 %
Basophils Absolute: 0 10*3/uL (ref 0.0–0.1)
EOS ABS: 0.2 10*3/uL (ref 0.0–0.5)
EOS PCT: 3 %
HCT: 38.9 % — ABNORMAL LOW (ref 39.0–52.0)
Hemoglobin: 13 g/dL (ref 13.0–17.0)
Immature Granulocytes: 0 %
Lymphocytes Relative: 17 %
Lymphs Abs: 1 10*3/uL (ref 0.7–4.0)
MCH: 30.4 pg (ref 26.0–34.0)
MCHC: 33.4 g/dL (ref 30.0–36.0)
MCV: 90.9 fL (ref 80.0–100.0)
MONO ABS: 0.6 10*3/uL (ref 0.1–1.0)
Monocytes Relative: 11 %
Neutro Abs: 3.9 10*3/uL (ref 1.7–7.7)
Neutrophils Relative %: 68 %
PLATELETS: 177 10*3/uL (ref 150–400)
RBC: 4.28 MIL/uL (ref 4.22–5.81)
RDW: 13.2 % (ref 11.5–15.5)
WBC: 5.8 10*3/uL (ref 4.0–10.5)
nRBC: 0 % (ref 0.0–0.2)

## 2018-07-07 LAB — LIPASE, BLOOD: Lipase: 26 U/L (ref 11–51)

## 2018-07-07 LAB — URINALYSIS, COMPLETE (UACMP) WITH MICROSCOPIC
Bilirubin Urine: NEGATIVE
GLUCOSE, UA: NEGATIVE mg/dL
Ketones, ur: NEGATIVE mg/dL
Nitrite: NEGATIVE
PROTEIN: NEGATIVE mg/dL
Specific Gravity, Urine: 1.006 (ref 1.005–1.030)
pH: 5 (ref 5.0–8.0)

## 2018-07-07 LAB — COMPREHENSIVE METABOLIC PANEL
ALBUMIN: 4.1 g/dL (ref 3.5–5.0)
ALK PHOS: 67 U/L (ref 38–126)
ALT: 22 U/L (ref 0–44)
AST: 25 U/L (ref 15–41)
Anion gap: 8 (ref 5–15)
BUN: 16 mg/dL (ref 8–23)
CHLORIDE: 105 mmol/L (ref 98–111)
CO2: 25 mmol/L (ref 22–32)
CREATININE: 0.87 mg/dL (ref 0.61–1.24)
Calcium: 8.8 mg/dL — ABNORMAL LOW (ref 8.9–10.3)
GFR calc Af Amer: >60 mL/min (ref >60)
GFR calc non Af Amer: >60 mL/min (ref >60)
GLUCOSE: 187 mg/dL — AB (ref 70–99)
Potassium: 4.5 mmol/L (ref 3.5–5.1)
Sodium: 138 mmol/L (ref 135–145)
Total Bilirubin: 1.5 mg/dL — ABNORMAL HIGH (ref 0.3–1.2)
Total Protein: 6.7 g/dL (ref 6.5–8.1)

## 2018-07-07 LAB — TROPONIN I

## 2018-07-07 MED ORDER — SODIUM CHLORIDE 0.9 % IV BOLUS
500.0000 mL | Freq: Once | INTRAVENOUS | Status: AC
  Administered 2018-07-07: 500 mL via INTRAVENOUS

## 2018-07-07 MED ORDER — CYCLOBENZAPRINE HCL 5 MG PO TABS: 5.0000 mg | ORAL_TABLET | Freq: Two times a day (BID) | ORAL | 0 refills | Status: DC | PRN

## 2018-07-07 NOTE — Discharge Instructions (Signed)
You likely had a pinched nerve in your back.   Continue advil for pain   Add flexeril for severe pain   See your doctor for possible physical therapy.   Finish your antibiotics as prescribed by your doctor   Follow up with ortho for possible injection in your back   Return to ER if you have worse back pain, weakness, numbness, trouble walking or trouble urinating

## 2018-07-07 NOTE — ED Notes (Addendum)
Pt is AOx4, VSS, in bed with rails upx2, on the monitor and caregiver is at the bedside. Pt does not c/o any pain at this time. We will continue to monitor the pt.

## 2018-07-07 NOTE — ED Triage Notes (Signed)
Pt to ED via POV with c/o RT flank pain, recent dx UTI and currently on antibiotics. PT denies any worsening GU symptoms. VSS , pt A&OX4

## 2018-07-07 NOTE — ED Notes (Signed)
Pt is being discharged to home with caregiver. Pt AOx4, vss, he denies any pain at this time. AVS was given and explained to pt and caregiver whom each verbalized understanding.

## 2018-07-07 NOTE — ED Provider Notes (Addendum)
[facility] EMERGENCY DEPARTMENT Provider Note   CSN: [id number] Arrival date & time: 07/07/18  4098     History   Chief Complaint Chief Complaint  Patient presents with  . Flank Pain    HPI Benjamin Parks is a 84 y.o. male history of diabetes, previous MI, hypertension, hyperlipidemia who presenting with right flank pain.  Patient states that he saw his doctor 2 days ago and was diagnosed with urinary tract infection.  Patient is put on Omnicef and is still taking it.  Since last night, he noticed worsening right-sided low back pain.  Patient states that the pain radiated down his leg he denies any numbness or weakness.  He had some hematuria before but denies any gross hematuria currently.  He denies any trauma or injury to the back.  Of note, patient does have a history of pinched nerve previously.  The history is provided by the patient.    Past Medical History:  Diagnosis Date  . Diabetes mellitus without complication (HCC)   . History of heart attack   . Hyperlipidemia   . Hypertension   . PVC (premature ventricular contraction)   . Right bundle branch block   . Stroke [facility])     Patient Active Problem List   Diagnosis Date Noted  . Non-STEMI (non-ST elevated myocardial infarction) (HCC) 11/07/2015  . Community acquired pneumonia 11/07/2015  . NSTEMI (non-ST elevated myocardial infarction) (HCC) 11/07/2015    Past Surgical History:  Procedure Laterality Date  . APPENDECTOMY    . CARDIAC CATHETERIZATION N/A 11/08/2015   Procedure: Left Heart Cath and Coronary Angiography;  Surgeon: Dalia Heading, MD;  Location: [facility] INVASIVE CV LAB;  Service: Cardiovascular;  Laterality: N/A;  . HERNIA REPAIR    . none    . TONSILLECTOMY          Home Medications    Prior to Admission medications   Medication Sig Start Date End Date Taking? Authorizing Provider  aspirin EC 81 MG EC tablet Take 1 tablet (81 mg total) by mouth daily. 11/09/15   Adrian Saran, MD  atorvastatin (LIPITOR) 80 MG tablet Take 0.5 tablets (40 mg total) by mouth daily at 6 PM. 11/09/15   Adrian Saran, MD  cefdinir (OMNICEF) 300 MG capsule Take 1 capsule (300 mg total) by mouth 2 (two) times daily. 03/19/17   Myrna Blazer, MD  citalopram (CELEXA) 20 MG tablet Take 20 mg by mouth daily.    [provider]  clopidogrel (PLAVIX) 75 MG tablet Take 1 tablet (75 mg total) by mouth daily. 11/09/15   Adrian Saran, MD  cyanocobalamin (,VITAMIN B-12,) 1000 MCG/ML injection Inject 1,000 mcg into the muscle every 30 (thirty) days.    [provider]  diclofenac sodium (VOLTAREN) 1 % GEL Apply 2 g topically 2 (two) times daily as needed for pain. 01/13/17   [provider]  finasteride (PROSCAR) 5 MG tablet Take 5 mg by mouth daily. 02/19/17   [provider]  glipiZIDE (GLUCOTROL) 5 MG tablet Take 0.5 tablets (2.5 mg total) by mouth daily before breakfast. 11/09/15   Adrian Saran, MD  Lancets (ACCU-CHEK MULTICLIX) lancets Use as instructed 11/09/15   Adrian Saran, MD  levofloxacin (LEVAQUIN) 500 MG tablet Take 1 tablet (500 mg total) by mouth daily. 11/09/15   Adrian Saran, MD  lidocaine (LIDODERM) 5 % Place 1 patch onto the skin daily. 10/18/16   Jennye Moccasin, MD  lisinopril (PRINIVIL,ZESTRIL) 2.5 MG tablet Take 1  tablet (2.5 mg total) by mouth daily. 11/09/15   Adrian Saran, MD  metoprolol tartrate (LOPRESSOR) 25 MG tablet Take 1 tablet (25 mg total) by mouth 2 (two) times daily. 11/09/15   Adrian Saran, MD  nitroGLYCERIN (NITROSTAT) 0.4 MG SL tablet Place 1 tablet (0.4 mg total) under the tongue every 5 (five) minutes x 3 doses as needed for chest pain. 11/09/15   Adrian Saran, MD    Family History Family History  Problem Relation Age of Onset  . Heart attack Mother        deceased  . Heart attack Father        deceased    Social History Social History   Tobacco Use  . Smoking status: Former Games developer  . Smokeless tobacco: Never Used    Substance Use Topics  . Alcohol use: Yes    Alcohol/week: 0.0 standard drinks    Comment: one cocktail every day.  . Drug use: No     Allergies   Azithromycin and Metformin and related   Review of Systems Review of Systems  Genitourinary: Positive for flank pain.  All other systems reviewed and are negative.    Physical Exam Updated Vital Signs BP (!) 161/51 (BP Location: Right Arm)   Pulse 68   Temp (!) 97.5 F (36.4 C) (Oral)   Resp 16   SpO2 98%   Physical Exam  Constitutional: He is oriented to person, place, and time.  Well appearing for age   HENT:  Head: Normocephalic.  Mouth/Throat: Oropharynx is clear and moist.  Eyes: Pupils are equal, round, and reactive to light. Conjunctivae and EOM are normal.  Neck: Normal range of motion. Neck supple.  Cardiovascular: Normal rate, regular rhythm and normal heart sounds.  Pulmonary/Chest: Effort normal and breath sounds normal. No stridor. No respiratory distress.  Abdominal: Soft. Bowel sounds are normal.  Mild R CVAT vs R paralumbar tenderness   Musculoskeletal: Normal range of motion.  No saddle anesthesia, nl ROM bilateral hips   Neurological: He is alert and oriented to person, place, and time. No cranial nerve deficit. Coordination normal.  No saddle anesthesia, nl strength and sensation and reflexes bilateral lower extremities   Skin: Skin is warm.  Psychiatric: He has a normal mood and affect.  Nursing note and vitals reviewed.    ED Treatments / Results  Labs (all labs ordered are listed, but only abnormal results are displayed) Labs Reviewed  CBC WITH DIFFERENTIAL/PLATELET - Abnormal; Notable for the following components:      Result Value   HCT 38.9 (*)    All other components within normal limits  URINE CULTURE  COMPREHENSIVE METABOLIC PANEL  LIPASE, BLOOD  URINALYSIS, COMPLETE (UACMP) WITH MICROSCOPIC  TROPONIN I    EKG None   ED ECG REPORT I, Richardean Canal, the attending physician,  personally viewed and interpreted this ECG.   Date: 07/07/2018  EKG Time: 10:09 am  Rate: 61  Rhythm: normal EKG, normal sinus rhythm  Axis: normal  Intervals:right bundle branch block and left anterior fascicular block  ST&T Change: nonspecific, unchanged from previous    Radiology Ct L-spine No Charge  Result Date: 07/07/2018 CLINICAL DATA:  Right flank pain. Recent diagnosis of urinary tract infection. EXAM: CT LUMBAR SPINE WITHOUT CONTRAST TECHNIQUE: Multidetector CT imaging of the lumbar spine was performed without intravenous contrast administration. Multiplanar CT image reconstructions were also generated. COMPARISON:  MRI lumbar spine 04/13/2007. FINDINGS: Segmentation: Standard. Alignment: No listhesis. Convex left scoliosis with the  apex at L2-3 noted. Vertebrae: No fracture or worrisome lesion. 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 No pars defect. Paraspinal and other soft tissues: See report of dedicated CT abdomen and pelvis this same day. Disc levels: T12-L1: Negative. L1-2: There is loss of disc space height with a shallow bulge and endplate spur. Mild to moderate facet degenerative disease on the right. The central canal and foramina appear open. L2-3: Mild facet degenerative change.  Otherwise negative. L3-4: Shallow disc bulge with endplate spur, ligamentum flavum thickening and mild facet degenerative disease. There is mild central canal stenosis. The foramina are open. L4-5: Bulky ligamentum flavum thickening, there is loss of disc space height with endplate spurring, bulky ligamentum flavum thickening and advanced facet degenerative disease. Severe central canal stenosis and moderate to moderately severe bilateral foraminal narrowing is present. Spondylosis appears worse than on the prior exam. L5-S1: Advanced bilateral facet degenerative disease, ligamentum flavum thickening and a shallow disc bulge with endplate spur. Moderate to moderately severe  central canal stenosis and bilateral subarticular recess narrowing are seen. Moderately severe to severe bilateral foraminal narrowing appears worse on the left. IMPRESSION: No acute abnormality. Severe central canal stenosis and moderate to moderately severe bilateral foraminal narrowing at L4-5 appear somewhat worse than on the prior exam. Moderate to moderately severe central canal stenosis, marked narrowing in the lateral recesses and moderately severe to severe bilateral foraminal narrowing at L5-S1 do not appear grossly changed compared to the prior MRI. Electronically Signed   By: Drusilla Kanner M.D.   On: 07/07/2018 10:19   Ct Renal Stone Study  Result Date: 07/07/2018 CLINICAL DATA:  Acute right flank pain. EXAM: CT ABDOMEN AND PELVIS WITHOUT CONTRAST TECHNIQUE: Multidetector CT imaging of the abdomen and pelvis was performed following the standard protocol without IV contrast. COMPARISON:  CT scan of February 29, 2008. FINDINGS: Lower chest: No acute abnormality. Hepatobiliary: No gallstones or biliary dilatation is noted. Stable right hepatic cysts are noted. Pancreas: Unremarkable. No pancreatic ductal dilatation or surrounding inflammatory changes. Spleen: Normal in size without focal abnormality. Adrenals/Urinary Tract: Adrenal glands are unremarkable. Kidneys are normal, without renal calculi, focal lesion, or hydronephrosis. Bladder is unremarkable. Stomach/Bowel: The stomach appears normal. There is no evidence of bowel obstruction or inflammation. Status post appendectomy. Diverticulosis of descending and sigmoid colon is noted. Vascular/Lymphatic: Aortic atherosclerosis. No enlarged abdominal or pelvic lymph nodes. Reproductive: Stable moderate prostatic enlargement is noted. Other: Small fat containing left inguinal hernia is noted. No ascites is noted. Musculoskeletal: No acute or significant osseous findings. IMPRESSION: No hydronephrosis or renal obstruction is noted. No renal or ureteral  calculi are noted. Diverticulosis of descending and sigmoid colon is noted without inflammation. Stable moderate prostatic enlargement is noted. Small fat containing left inguinal hernia is noted. Aortic Atherosclerosis (ICD10-I70.0). Electronically Signed   By: Lupita Raider, M.D.   On: 07/07/2018 10:34    Procedures Procedures (including critical care time)  Medications Ordered in ED Medications - No data to display   Initial Impression / Assessment and Plan / ED Course  I have reviewed the triage vital signs and the nursing notes.  Pertinent labs & imaging results that were available during my care of the patient were reviewed by me and considered in my medical decision making (see chart for details).    Benjamin Parks is a 84 y.o. male here with R flank pain, R back pain. Consider renal colic vs pyelo vs lumbar radiculopathy. Will get labs, UA, CT renal  stone with CT lumbar spine. Patient took alleve prior to arrival and doesn't want any pain meds currently.   1:26 PM Labs unremarkable. UA showed UTI that is treated. CT showed no hydro. There is new L4-5 stenosis. I think likely radiculopathy. Ambulated in the ED, doesn't require pain meds in the ED. I hesitate to give him steroids or pain meds due to pain. Will try flexeril prn. Recommend physical therapy, continue NSAIDs, ortho follow up for possible steroid injections.    Final Clinical Impressions(s) / ED Diagnoses   Final diagnoses:  Flank pain    ED Discharge Orders    None       Charlynne Pander, MD 07/07/18 1328    Charlynne Pander, MD 07/07/18 [phone]

## 2018-07-08 LAB — URINE CULTURE: Culture: NO GROWTH

## 2018-08-09 ENCOUNTER — Encounter: Payer: Self-pay | Admitting: Urology

## 2018-08-09 ENCOUNTER — Ambulatory Visit (INDEPENDENT_AMBULATORY_CARE_PROVIDER_SITE_OTHER): Payer: Medicare Other | Admitting: Urology

## 2018-08-09 VITALS — BP 135/73 | HR 64 | Temp 98.4°F | Resp 14 | Wt 178.1 lb

## 2018-08-09 DIAGNOSIS — R310 Gross hematuria: Secondary | ICD-10-CM | POA: Diagnosis not present

## 2018-08-09 NOTE — Progress Notes (Signed)
08/09/2018 11:53 AM   Benjamin Parks 1919/06/01 [id number]  Referring provider: Jerl Parks, James, MD [address] [facility] [place], [place] [address]  CC: Gross hematuria  HPI: I had the pleasure of meeting Benjamin Parks in urology clinic today in consultation for gross hematuria from Dr. Burnett ShengHedrick.  He is a 84 year old very active and functional man with a history of coronary artery disease and stroke on aspirin and Plavix who reports 2 months of intermittent hematuria.  84 notes some blood in the tip of his penis after he voids, but has not passed any blood clots.  He denies any urinary complaints or difficulty with urination.  He did have microscopic hematuria with 6-10 RBCs in October 2019, culture was negative.  He had some abdominal pain at that time and a noncontrast CT scan showed no hydronephrosis or stones, and a 74 g prostate.  He has a 75-pack-year smoking history, and quit 20 years ago.  He previously worked as an Youth workerexpiration geologist and denies any carcinogenic exposures.  No family history of bladder cancer.  He denies any flank pain.  There are no aggravating or alleviating factors.  Severity is mild.   PMH: Past Medical History:  Diagnosis Date  . Diabetes mellitus without complication (HCC)   . History of heart attack   . Hyperlipidemia   . Hypertension   . PVC (premature ventricular contraction)   . Right bundle branch block   . Stroke [facility](HCC)     Surgical History: Past Surgical History:  Procedure Laterality Date  . APPENDECTOMY    . CARDIAC CATHETERIZATION N/A 11/08/2015   Procedure: Left Heart Cath and Coronary Angiography;  Surgeon: Dalia HeadingKenneth A Fath, MD;  Location: [facility] INVASIVE CV LAB;  Service: Cardiovascular;  Laterality: N/A;  . HERNIA REPAIR    . none    . TONSILLECTOMY      Home Medications:  Allergies as of 08/09/2018      Reactions   Azithromycin    Metformin And Related Other (See Comments)   unknown      Medication List        Accurate as of 08/09/18 11:53 AM. Always use your most recent med list.          accu-chek multiclix lancets Use as instructed   aspirin 81 MG EC tablet Take 1 tablet (81 mg total) by mouth daily.   atorvastatin 80 MG tablet Commonly known as:  LIPITOR Take 0.5 tablets (40 mg total) by mouth daily at 6 PM.   cefdinir 300 MG capsule Commonly known as:  OMNICEF Take 1 capsule (300 mg total) by mouth 2 (two) times daily.   citalopram 20 MG tablet Commonly known as:  CELEXA Take 20 mg by mouth daily.   clopidogrel 75 MG tablet Commonly known as:  PLAVIX Take 1 tablet (75 mg total) by mouth daily.   cyanocobalamin 1000 MCG/ML injection Commonly known as:  (VITAMIN B-12) Inject 1,000 mcg into the muscle every 30 (thirty) days.   cyclobenzaprine 5 MG tablet Commonly known as:  FLEXERIL Take 1 tablet (5 mg total) by mouth 2 (two) times daily as needed for muscle spasms.   diclofenac sodium 1 % Gel Commonly known as:  VOLTAREN Apply 2 g topically 2 (two) times daily as needed for pain.   finasteride 5 MG tablet Commonly known as:  PROSCAR Take 5 mg by mouth daily.   glipiZIDE 5 MG tablet Commonly known as:  GLUCOTROL Take 0.5 tablets (2.5 mg total) by  mouth daily before breakfast.   levofloxacin 500 MG tablet Commonly known as:  LEVAQUIN Take 1 tablet (500 mg total) by mouth daily.   lidocaine 5 % Commonly known as:  LIDODERM Place 1 patch onto the skin daily.   lisinopril 2.5 MG tablet Commonly known as:  PRINIVIL,ZESTRIL Take 1 tablet (2.5 mg total) by mouth daily.   metoprolol tartrate 25 MG tablet Commonly known as:  LOPRESSOR Take 1 tablet (25 mg total) by mouth 2 (two) times daily.   nitroGLYCERIN 0.4 MG SL tablet Commonly known as:  NITROSTAT Place 1 tablet (0.4 mg total) under the tongue every 5 (five) minutes x 3 doses as needed for chest pain.       Allergies:  Allergies  Allergen Reactions  . Azithromycin   . Metformin And Related Other (See  Comments)    unknown    Family History: Family History  Problem Relation Age of Onset  . Heart attack Mother        deceased  . Heart attack Father        deceased    Social History:  reports that he has quit smoking. He has never used smokeless tobacco. He reports that he drinks alcohol. He reports that he does not use drugs.  ROS: Please see flowsheet from today's date for complete review of systems.  Physical Exam: BP 135/73   Pulse 64   Temp 98.4 F (36.9 C)   Resp 14   Wt 178 lb 1.6 oz (80.8 kg)   BMI 25.55 kg/m    Constitutional:  Alert and oriented, No acute distress. Cardiovascular: No clubbing, cyanosis, or edema. Respiratory: Normal respiratory effort, no increased work of breathing. GI: Abdomen is soft, nontender, nondistended, no abdominal masses GU: No CVA tenderness, uncircumcised phallus without lesions, unable to fully retract foreskin, widely patent meatus Lymph: No cervical or inguinal lymphadenopathy. Skin: No rashes, bruises or suspicious lesions. Neurologic: Grossly intact, no focal deficits, moving all 4 extremities. Psychiatric: Normal mood and affect.  Laboratory Data: Reviewed  Urinalysis 07/07/2018: 6-10 RBCs, 0-5 WBCs, rare bacteria, nitrite negative  Pertinent Imaging: I have personally reviewed the CT stone protocol dated 07/07/2018.  No hydronephrosis or ureterolithiasis, prostate measures 74 cc  Assessment & Plan:   In summary, Mr. Hang is a very functional 84 year old male who appears younger than stated age with a history of coronary artery disease and stroke on aspirin and Plavix who presents with 2 months of mild gross hematuria.  We discussed common possible etiologies of hematuria including BPH, malignancy, urolithiasis, medical renal disease, and idiopathic. Standard workup recommended by the AUA includes imaging with CT urogram to assess the upper tracts, and cystoscopy. Cytology is performed on patient's with gross hematuria  to look for malignant cells in the urine.  We discussed the risks and benefits of work-up in somebody with advanced age 84d comorbidities, however he is very functional and I do feel he would potentially benefit from CT urogram and cystoscopy to rule out any bladder tumors.  Return in about 3 weeks (around 08/30/2018) for CTU and cysto.  Sondra Come, MD  [facility] [address] [place], [place] [address] [phone]

## 2018-08-24 ENCOUNTER — Ambulatory Visit
Admission: RE | Admit: 2018-08-24 | Discharge: 2018-08-24 | Disposition: A | Discharge status: Home / Self Care | Payer: Medicare Other | Source: Ambulatory Visit | Attending: Urology | Admitting: Urology

## 2018-08-24 DIAGNOSIS — R310 Gross hematuria: Secondary | ICD-10-CM | POA: Insufficient documentation

## 2018-08-24 LAB — POCT I-STAT CREATININE: Creatinine, Ser: 0.9 mg/dL (ref 0.61–1.24)

## 2018-08-24 MED ORDER — IOHEXOL 300 MG/ML  SOLN
125.0000 mL | Freq: Once | INTRAMUSCULAR | Status: AC | PRN
  Administered 2018-08-24: 125 mL via INTRAVENOUS

## 2018-09-01 ENCOUNTER — Other Ambulatory Visit: Payer: Medicare Other | Admitting: Urology

## 2018-09-01 ENCOUNTER — Ambulatory Visit (INDEPENDENT_AMBULATORY_CARE_PROVIDER_SITE_OTHER): Payer: Medicare Other | Admitting: Urology

## 2018-09-01 ENCOUNTER — Encounter: Payer: Self-pay | Admitting: Urology

## 2018-09-01 VITALS — BP 156/61 | HR 62 | Wt 183.0 lb

## 2018-09-01 DIAGNOSIS — R310 Gross hematuria: Secondary | ICD-10-CM | POA: Diagnosis not present

## 2018-09-01 LAB — URINALYSIS, COMPLETE
Bilirubin, UA: NEGATIVE
Ketones, UA: NEGATIVE
NITRITE UA: NEGATIVE
Protein, UA: NEGATIVE
RBC, UA: NEGATIVE
SPEC GRAV UA: 1.01 (ref 1.005–1.030)
Urobilinogen, Ur: 0.2 mg/dL (ref 0.2–1.0)
pH, UA: 5.5 (ref 5.0–7.5)

## 2018-09-01 LAB — MICROSCOPIC EXAMINATION
Epithelial Cells (non renal): NONE SEEN /hpf (ref 0–10)
RBC MICROSCOPIC, UA: NONE SEEN /HPF (ref 0–2)

## 2018-09-01 NOTE — Progress Notes (Signed)
Cystoscopy Procedure Note:  Indication: Gross hematuria  After informed consent and discussion of the procedure and its risks, Benjamin Parks was positioned and prepped in the standard fashion. Cystoscopy was performed with a flexible cystoscope. The urethra, bladder neck and entire bladder was visualized in a standard fashion. The prostate was large with an intravesical median lobe. Multiple trabeculations and diverticuli. Retroflexion showed some dilated vessels at the median lobe of the prostate. Cytology sent.  Imaging: CT urogram with no filling defects, renal masses, or hydronephrosis.  Findings: Large intravesical median lobe with dilated vessels  Assessment and Plan: Follow up cytology, if positive consider biopsy/mini-TURP  Legrand RamsBrian Sninsky, MD 09/01/2018

## 2018-09-06 ENCOUNTER — Other Ambulatory Visit: Payer: Self-pay | Admitting: Urology

## 2018-10-30 ENCOUNTER — Emergency Department: Payer: Medicare Other

## 2018-10-30 ENCOUNTER — Encounter: Payer: Self-pay | Admitting: Emergency Medicine

## 2018-10-30 ENCOUNTER — Inpatient Hospital Stay
Admission: EM | Admit: 2018-10-30 | Discharge: 2018-11-01 | DRG: 872 | Disposition: A | Discharge status: Home / Self Care | Payer: Medicare Other | Attending: Internal Medicine | Admitting: Internal Medicine

## 2018-10-30 DIAGNOSIS — Z881 Allergy status to other antibiotic agents status: Secondary | ICD-10-CM | POA: Diagnosis not present

## 2018-10-30 DIAGNOSIS — Z7902 Long term (current) use of antithrombotics/antiplatelets: Secondary | ICD-10-CM

## 2018-10-30 DIAGNOSIS — R109 Unspecified abdominal pain: Secondary | ICD-10-CM | POA: Diagnosis present

## 2018-10-30 DIAGNOSIS — I252 Old myocardial infarction: Secondary | ICD-10-CM | POA: Diagnosis not present

## 2018-10-30 DIAGNOSIS — Z888 Allergy status to other drugs, medicaments and biological substances status: Secondary | ICD-10-CM

## 2018-10-30 DIAGNOSIS — Z7982 Long term (current) use of aspirin: Secondary | ICD-10-CM | POA: Diagnosis not present

## 2018-10-30 DIAGNOSIS — E860 Dehydration: Secondary | ICD-10-CM | POA: Diagnosis present

## 2018-10-30 DIAGNOSIS — N390 Urinary tract infection, site not specified: Secondary | ICD-10-CM | POA: Diagnosis present

## 2018-10-30 DIAGNOSIS — K5720 Diverticulitis of large intestine with perforation and abscess without bleeding: Secondary | ICD-10-CM | POA: Diagnosis present

## 2018-10-30 DIAGNOSIS — Z66 Do not resuscitate: Secondary | ICD-10-CM | POA: Diagnosis present

## 2018-10-30 DIAGNOSIS — E1165 Type 2 diabetes mellitus with hyperglycemia: Secondary | ICD-10-CM | POA: Diagnosis present

## 2018-10-30 DIAGNOSIS — I10 Essential (primary) hypertension: Secondary | ICD-10-CM | POA: Diagnosis present

## 2018-10-30 DIAGNOSIS — Z87891 Personal history of nicotine dependence: Secondary | ICD-10-CM

## 2018-10-30 DIAGNOSIS — Z87440 Personal history of urinary (tract) infections: Secondary | ICD-10-CM | POA: Diagnosis not present

## 2018-10-30 DIAGNOSIS — Z79899 Other long term (current) drug therapy: Secondary | ICD-10-CM

## 2018-10-30 DIAGNOSIS — H9190 Unspecified hearing loss, unspecified ear: Secondary | ICD-10-CM | POA: Diagnosis present

## 2018-10-30 DIAGNOSIS — E785 Hyperlipidemia, unspecified: Secondary | ICD-10-CM | POA: Diagnosis present

## 2018-10-30 DIAGNOSIS — Z8673 Personal history of transient ischemic attack (TIA), and cerebral infarction without residual deficits: Secondary | ICD-10-CM | POA: Diagnosis not present

## 2018-10-30 DIAGNOSIS — A419 Sepsis, unspecified organism: Principal | ICD-10-CM | POA: Diagnosis present

## 2018-10-30 DIAGNOSIS — Z8249 Family history of ischemic heart disease and other diseases of the circulatory system: Secondary | ICD-10-CM | POA: Diagnosis not present

## 2018-10-30 DIAGNOSIS — R17 Unspecified jaundice: Secondary | ICD-10-CM | POA: Diagnosis present

## 2018-10-30 DIAGNOSIS — K5732 Diverticulitis of large intestine without perforation or abscess without bleeding: Secondary | ICD-10-CM | POA: Diagnosis present

## 2018-10-30 LAB — CBC
HEMATOCRIT: 42.7 % (ref 39.0–52.0)
HEMOGLOBIN: 14.4 g/dL (ref 13.0–17.0)
MCH: 30.3 pg (ref 26.0–34.0)
MCHC: 33.7 g/dL (ref 30.0–36.0)
MCV: 89.7 fL (ref 80.0–100.0)
NRBC: 0 % (ref 0.0–0.2)
Platelets: 222 10*3/uL (ref 150–400)
RBC: 4.76 MIL/uL (ref 4.22–5.81)
RDW: 13.2 % (ref 11.5–15.5)
WBC: 19.2 10*3/uL — AB (ref 4.0–10.5)

## 2018-10-30 LAB — PHOSPHORUS: Phosphorus: 4 mg/dL (ref 2.5–4.6)

## 2018-10-30 LAB — COMPREHENSIVE METABOLIC PANEL
ALK PHOS: 76 U/L (ref 38–126)
ALT: 23 U/L (ref 0–44)
AST: 18 U/L (ref 15–41)
Albumin: 4 g/dL (ref 3.5–5.0)
Anion gap: 10 (ref 5–15)
BUN: 31 mg/dL — ABNORMAL HIGH (ref 8–23)
CO2: 21 mmol/L — AB (ref 22–32)
Calcium: 8.8 mg/dL — ABNORMAL LOW (ref 8.9–10.3)
Chloride: 104 mmol/L (ref 98–111)
Creatinine, Ser: 0.98 mg/dL (ref 0.61–1.24)
GFR calc Af Amer: >60 mL/min (ref >60)
GFR calc non Af Amer: >60 mL/min (ref >60)
GLUCOSE: 243 mg/dL — AB (ref 70–99)
Potassium: 4.4 mmol/L (ref 3.5–5.1)
SODIUM: 135 mmol/L (ref 135–145)
Total Bilirubin: 1.7 mg/dL — ABNORMAL HIGH (ref 0.3–1.2)
Total Protein: 6.9 g/dL (ref 6.5–8.1)

## 2018-10-30 LAB — URINALYSIS, COMPLETE (UACMP) WITH MICROSCOPIC
BILIRUBIN URINE: NEGATIVE
Glucose, UA: NEGATIVE mg/dL
Ketones, ur: NEGATIVE mg/dL
NITRITE: NEGATIVE
PROTEIN: NEGATIVE mg/dL
Specific Gravity, Urine: 1.014 (ref 1.005–1.030)
pH: 5 (ref 5.0–8.0)

## 2018-10-30 LAB — LIPASE, BLOOD: Lipase: 28 U/L (ref 11–51)

## 2018-10-30 LAB — MAGNESIUM: Magnesium: 2 mg/dL (ref 1.7–2.4)

## 2018-10-30 LAB — LACTIC ACID, PLASMA: Lactic Acid, Venous: 2.1 mmol/L (ref 0.5–1.9)

## 2018-10-30 MED ORDER — SODIUM CHLORIDE 0.9 % IV SOLN
1000.0000 mL | Freq: Once | INTRAVENOUS | Status: AC
  Administered 2018-10-30: 1000 mL via INTRAVENOUS

## 2018-10-30 MED ORDER — SODIUM CHLORIDE 0.9% FLUSH: 3.0000 mL | Freq: Once | INTRAVENOUS | Status: DC

## 2018-10-30 MED ORDER — MORPHINE SULFATE (PF) 2 MG/ML IV SOLN: 2.0000 mg | INTRAVENOUS | Status: DC | PRN

## 2018-10-30 MED ORDER — PIPERACILLIN-TAZOBACTAM 3.375 G IVPB 30 MIN: 3.3750 g | Freq: Once | INTRAVENOUS | Status: DC

## 2018-10-30 MED ORDER — IOHEXOL 300 MG/ML  SOLN
100.0000 mL | Freq: Once | INTRAMUSCULAR | Status: AC | PRN
  Administered 2018-10-30: 100 mL via INTRAVENOUS

## 2018-10-30 MED ORDER — IOPAMIDOL (ISOVUE-300) INJECTION 61%
30.0000 mL | Freq: Once | INTRAVENOUS | Status: AC | PRN
  Administered 2018-10-30: 30 mL via ORAL

## 2018-10-30 MED ORDER — MORPHINE SULFATE (PF) 4 MG/ML IV SOLN: 4.0000 mg | INTRAVENOUS | Status: DC | PRN

## 2018-10-30 MED ORDER — PIPERACILLIN-TAZOBACTAM 3.375 G IVPB 30 MIN
3.3750 g | Freq: Three times a day (TID) | INTRAVENOUS | Status: DC
  Administered 2018-10-31 – 2018-11-01 (×5): 3.375 g via INTRAVENOUS
  Filled 2018-10-30 (×12): qty 50

## 2018-10-30 MED ORDER — LACTATED RINGERS IV SOLN
INTRAVENOUS | Status: AC
  Administered 2018-10-31: 01:00:00 via INTRAVENOUS

## 2018-10-30 NOTE — ED Triage Notes (Signed)
Pt c/o lower abdominal pain that started at 0930 this AM after breakfast. Pt denies N/V/D. Pt is A&O x4 and sts, "I am here because I am 85 y/o and I shouldn't be hurting like this."

## 2018-10-30 NOTE — H&P (Signed)
[employer] [facility] [place] [facility]   PATIENT NAME: Benjamin Parks    MR#:  [id number]  DATE OF BIRTH:  [birth year]  DATE OF ADMISSION:  10/30/2018  PRIMARY CARE PHYSICIAN: Jerl Mina, MD   REQUESTING/REFERRING PHYSICIAN: Jene Every, MD  CHIEF COMPLAINT:   Chief Complaint  Patient presents with  . Abdominal Pain    HISTORY OF PRESENT ILLNESS:  Benjamin Parks  is a 85 y.o. male with a known history of T2NIDDM, HTN, HLD p/w AP, sigmoid diverticulitis + suspected microperforation. WBC 19.2, afebrile, SIRS (-). Comfortable, does not appear septic/toxic. AAOx3, hard of hearing, but able to provide complete Hx. Appears much younger than stated age. Active, swims 3x/wk. Developed "throbbing" lower AP (RLQ + LLQ) @~0900AM on Sat 02/15 after breakfast. Onset sudden. Pain constant and did not change in severity over the course of the day. States he had two loose BMs, and then a normal BM. (-) melena/hematochezia, (-) N/V, (-) urinary symptoms, (-) F/C/diaphoresis/rigors. CT A/P report reads, "Acute diverticulitis of the mid sigmoid colon, with associated bowel wall thickening and mild paracolic inflammation. Small collection of air is seen outside of the colon/diverticulum, consistent with perforation, suspected micro perforation. No abscess collection seen. No associated bowel obstruction."  PAST MEDICAL HISTORY:   Past Medical History:  Diagnosis Date  . Diabetes mellitus without complication (HCC)   . History of heart attack   . Hyperlipidemia   . Hypertension   . PVC (premature ventricular contraction)   . Right bundle branch block   . Stroke [facility])     PAST SURGICAL HISTORY:   Past Surgical History:  Procedure Laterality Date  . APPENDECTOMY    . CARDIAC CATHETERIZATION N/A 11/08/2015   Procedure: Left Heart Cath and Coronary Angiography;  Surgeon: Dalia Heading, MD;  Location: [facility] INVASIVE CV LAB;  Service: Cardiovascular;  Laterality: N/A;  . HERNIA  REPAIR    . none    . TONSILLECTOMY      SOCIAL HISTORY:   Social History   Tobacco Use  . Smoking status: Former Games developer  . Smokeless tobacco: Never Used  Substance Use Topics  . Alcohol use: Yes    Alcohol/week: 0.0 standard drinks    Comment: one cocktail every day.    FAMILY HISTORY:   Family History  Problem Relation Age of Onset  . Heart attack Mother        deceased  . Heart attack Father        deceased    DRUG ALLERGIES:   Allergies  Allergen Reactions  . Azithromycin   . Erythromycin Base     Other reaction(s): UNKNOWN  . Metformin And Related Other (See Comments)    unknown    REVIEW OF SYSTEMS:   Review of Systems  Constitutional: Negative for chills, diaphoresis, fever, malaise/fatigue and weight loss.  HENT: Negative for congestion, ear pain, hearing loss, nosebleeds, sinus pain, sore throat and tinnitus.   Eyes: Negative for blurred vision, double vision, photophobia, pain, discharge and redness.  Respiratory: Negative for cough, hemoptysis, sputum production, shortness of breath and wheezing.   Cardiovascular: Negative for chest pain, palpitations, orthopnea, claudication, leg swelling and PND.  Gastrointestinal: Positive for abdominal pain. Negative for blood in stool, constipation, diarrhea, heartburn, melena, nausea and vomiting.  Genitourinary: Negative for dysuria, frequency, hematuria and urgency.  Musculoskeletal: Negative for back pain, joint pain, myalgias and neck pain.  Skin: Negative for itching and rash.  Neurological: Negative for dizziness, tingling, tremors, sensory  change, speech change, focal weakness, seizures, loss of consciousness, weakness and headaches.  Psychiatric/Behavioral: Negative for depression and memory loss. The patient is not nervous/anxious and does not have insomnia.    MEDICATIONS AT HOME:   Prior to Admission medications   Medication Sig Start Date End Date Taking? Authorizing Provider  aspirin EC 81 MG EC  tablet Take 1 tablet (81 mg total) by mouth daily. 11/09/15  Yes Adrian SaranMody, Sital, MD  atorvastatin (LIPITOR) 80 MG tablet Take 0.5 tablets (40 mg total) by mouth daily at 6 PM. 11/09/15  Yes Mody, Sital, MD  clopidogrel (PLAVIX) 75 MG tablet Take 1 tablet (75 mg total) by mouth daily. 11/09/15  Yes Mody, Patricia PesaSital, MD  cyanocobalamin (,VITAMIN B-12,) 1000 MCG/ML injection Inject 1,000 mcg into the muscle every 30 (thirty) days.   Yes [provider]  Dextran 70-Hypromellose (ARTIFICIAL TEARS) 0.1-0.3 % SOLN Apply to eye.   Yes [provider]  diclofenac sodium (VOLTAREN) 1 % GEL Apply 2 g topically 2 (two) times daily as needed for pain. 01/13/17  Yes [provider]  finasteride (PROSCAR) 5 MG tablet Take 5 mg by mouth daily. 02/19/17  Yes [provider]  glipiZIDE (GLUCOTROL) 5 MG tablet Take 0.5 tablets (2.5 mg total) by mouth daily before breakfast. 11/09/15  Yes Mody, Sital, MD  Lancets (ACCU-CHEK MULTICLIX) lancets Use as instructed 11/09/15  Yes Mody, Sital, MD  lisinopril (PRINIVIL,ZESTRIL) 2.5 MG tablet Take 1 tablet (2.5 mg total) by mouth daily. 11/09/15  Yes Adrian SaranMody, Sital, MD  metoprolol tartrate (LOPRESSOR) 25 MG tablet Take 1 tablet (25 mg total) by mouth 2 (two) times daily. Patient taking differently: Take 25 mg by mouth daily.  11/09/15  Yes Adrian SaranMody, Sital, MD  Multiple Vitamins-Minerals (PRESERVISION AREDS) CAPS Take 1 capsule by mouth daily.  08/16/12  Yes [provider]  triamcinolone cream (KENALOG) 0.1 % APPLY TO AFFECTED AREA TWICE A DAY 07/27/18  Yes [provider]  nitroGLYCERIN (NITROSTAT) 0.4 MG SL tablet Place 1 tablet (0.4 mg total) under the tongue every 5 (five) minutes x 3 doses as needed for chest pain. 11/09/15   Adrian SaranMody, Sital, MD      VITAL SIGNS:  Blood pressure 138/70, pulse 69, temperature 98.7 F (37.1 C), temperature source Oral, resp. rate 18, SpO2 98 %.  PHYSICAL EXAMINATION:  Physical Exam Constitutional:      General:  He is not in acute distress.    Appearance: Normal appearance. He is not ill-appearing, toxic-appearing or diaphoretic.  HENT:     Head: Atraumatic.     Mouth/Throat:     Pharynx: Oropharynx is clear.  Eyes:     General: No scleral icterus.    Extraocular Movements: Extraocular movements intact.     Conjunctiva/sclera: Conjunctivae normal.  Neck:     Musculoskeletal: Neck supple.  Cardiovascular:     Rate and Rhythm: Normal rate and regular rhythm.     Heart sounds: Normal heart sounds. No murmur. No friction rub. No gallop.   Pulmonary:     Effort: No respiratory distress.     Breath sounds: Normal breath sounds. No stridor. No wheezing, rhonchi or rales.  Abdominal:     General: Bowel sounds are normal. There is no distension.     Palpations: Abdomen is soft.     Tenderness: There is no abdominal tenderness. There is no guarding or rebound.     Hernia: No hernia is present.  Musculoskeletal: Normal range of motion.  General: No swelling or tenderness.  Lymphadenopathy:     Cervical: No cervical adenopathy.  Skin:    General: Skin is warm and dry.     Findings: No erythema or rash.  Neurological:     Mental Status: He is alert and oriented to person, place, and time. Mental status is at baseline.  Psychiatric:        Mood and Affect: Mood normal.        Behavior: Behavior normal.        Thought Content: Thought content normal.        Judgment: Judgment normal.    LABORATORY PANEL:   CBC Recent Labs  Lab 10/30/18 2015  WBC 19.2*  HGB 14.4  HCT 42.7  PLT 222   ------------------------------------------------------------------------------------------------------------------  Chemistries  Recent Labs  Lab 10/30/18 2015  NA 135  K 4.4  CL 104  CO2 21*  GLUCOSE 243*  BUN 31*  CREATININE 0.98  CALCIUM 8.8*  AST 18  ALT 23  ALKPHOS 76  BILITOT 1.7*    ------------------------------------------------------------------------------------------------------------------  Cardiac Enzymes No results for input(s): TROPONINI in the last 168 hours. ------------------------------------------------------------------------------------------------------------------  RADIOLOGY:  Ct Abdomen Pelvis W Contrast  Result Date: 10/30/2018 CLINICAL DATA:  Lower abdominal pain starting this morning. EXAM: CT ABDOMEN AND PELVIS WITH CONTRAST TECHNIQUE: Multidetector CT imaging of the abdomen and pelvis was performed using the standard protocol following bolus administration of intravenous contrast. CONTRAST:  <MEASColleKarma YisroeHospital Of The University [facility] Pennsylvania19JeAdriana Sima<MEASURColleKarma YisroeMissouri River [facility] Center19JeAdriana Sima<MEASURColleKarma YisroeSt [facility]19JeAdriana Sima<MEASURColleKarma YisroeArk[facility]19JeAdriana Sima<MEASURColleKarma YisroePrisma [facility]19JeAdriana Sima<MEASURColleKarma YisroeSurgery Center [facility]19JeAdriana Sima<MEASURColleKarma YisroeThe [facility]19JeAdriana Sima<MEASURColleKarma YisroeFour Corners Ambulatory Surgery Center LLC19JeAdriana Sima<MEASURColleKarma YisroePresence [facility]19JeAdriana Sima<MEASURColleKarma YisroeB[facility]19JeAdriana Sima<MEASURColleKarma YisroeSan[facility]19JeAdriana Sima<MEASURColleKarma YisroeC[facility]19JeAdriana Sima<MEASURColleKarma YisroePho[facility]19JeAdriana Sima<MEASURColleKarma YisroeH[facility]19JeAdriana Sima<MEASURColleKarma YisroeGr[facility]19JeAdriana Sima<MEASURColleKarma YisroeThe Endoscopy [facility]19JeAdriana Sima<MEASURColleKarma YisroeGreen Surgery Center [facility]19JeAdriana Sima<MEASURColleKarma YisroeSouth[facility]19JeAdriana Sima<MEASURColleKarma YisroeAssoci[facility]19JeAdriana Sima<MEASURColleKarma YisroeExecutive Surgery Center [facility]19JeAdriana Sima<MEASURColleKarma YisroeY[facility]19JeAdriana Sima<MEASURColleKarma YisroeTexas Health [facility]19JeAdriana Sima<MEASURColleKarma YisroePlaza Surgery [facility]19JeAdriana Sima<MEASURColYisMarion Healthcare L<MEASURColleKarma YisroeWest W[facility]19JeAdriana Sima<MEASURColleKarma YisroeSouthwest Health Center Inc19JeAdriana Sima<MEASURColleKarma YisroeSanford [facility]emid[facility]19JeAdriana Sima<MEASURColleKarma YisroeMor[place]19JeAdriana Sima<MEASURColleKarma YisroeOdessa [facility]19JeAdriana Sima<MEASURColleKarma YisroeSkypark Surgery Center LLC19JeAdriana Simas[[id number]]nges IOHEXOL 300 MG/ML  SOLN COMPARISON:  CT abdomen dated 08/24/2018. FINDINGS: Lower chest: No acute abnormality. Hepatobiliary: Stable small cysts within the liver. Gallbladder appears normal, decompressed. No bile duct dilatation. Pancreas: Partially infiltrated with fat but otherwise unremarkable. Spleen: Normal in size without focal abnormality. Adrenals/Urinary Tract: Adrenal glands appear normal. Kidneys are unremarkable without mass, stone or hydronephrosis. No ureteral or bladder calculi identified. Stomach/Bowel: Focal thickening of the walls of the mid sigmoid colon, with associated pericolic inflammation, consistent with acute diverticulitis. Small collection of air is seen outside of the colon/diverticulum, consistent with perforation. Associated mild fluid stranding. No abscess collection. No dilated large or small bowel loops. Appendix is not seen but there are no inflammatory changes in the RIGHT lower quadrant to suggest acute appendicitis. Stomach is unremarkable. Vascular/Lymphatic: Aortic atherosclerosis. No acute appearing vascular abnormality. No enlarged lymph nodes. Reproductive: Prostate is unremarkable. Other: No abscess collection seen. Musculoskeletal: Degenerative spondylosis of the lumbar spine, moderate in degree. Additional  degenerative changes at the bilateral hip joints, moderate in degree. Nondisplaced fracture line within the anterior third of the L1 vertebral body, of uncertain age but developed since the previous exam. IMPRESSION: 1. Acute diverticulitis of the mid sigmoid colon, with associated bowel wall thickening and mild paracolic inflammation. Small collection of air is seen outside of the colon/diverticulum, consistent with perforation, suspected micro perforation. No abscess collection seen. No associated bowel obstruction. 2. Nondisplaced fracture line within the anterior third of the L1 vertebral body, of uncertain age but developed since the previous exam. This could be acute or subacute, but favor subacute. Recommend correlation with physical exam findings and/or nuclear medicine bone scan. Aortic Atherosclerosis (ICD10-I70.0). Electronically Signed   By: Stan  Maynard M.D.   On: 10/30/2018 22:27   IMPRESSION AND PLAN:   A/P: 89M AP, sigmoid diverticulitis + suspected microperforation, leukocytosis, SIRS (-). UTI. Hyperglycemia (w/ T2NIDDM), hypocalcemia, dehydration, mild hyperbilirubinemia -AP, sigmoid diverticulitis + suspected microperforation, leukocytosis: WBC 19.2. SIRS (-). Appears well. Zosyn, pain ctrl, IVF. -UTI: ABx as above. -Hyperglycemia, T2NIDDM: SSI. Hold  PO antihyperglycemics. -Hypocalcemia: Ionized calcium. -Dehydration, mild hyperbilirubinemia: IVF. -c/w other home meds/formulary subs as tolerated. -FEN/GI: Cardiac diabetic diet. -DVT PPx: Lovenox. -Code status: DNR/DNI at patient request. -Disposition: Admission, > 2 midnights.   All the records are reviewed and case discussed with ED provider. Management plans discussed with the patient, family and they are in agreement.  CODE STATUS: DNR/DNI.  TOTAL TIME TAKING CARE OF THIS PATIENT: 75 minutes.    Barbaraann Rondo M.D on 10/30/2018 at 11:37 PM  Between 7am to 6pm - Pager - [phone]  After 6pm go to [email or web address]  - Social research officer, government  [employer] [place] [facility]  Office  [phone]  CC: Primary care physician; Jerl Mina, MD   Note: This dictation was prepared with Dragon dictation along with smaller phrase technology. Any transcriptional errors that result from this process are unintentional.

## 2018-10-30 NOTE — ED Notes (Signed)
ED TO INPATIENT HANDOFF REPORT  Name/Age/Gender Benjamin Parks 85 y.o. male  Code Status    Code Status Orders  (From admission, onward)         Start     Ordered   10/30/18 2310  Do not attempt resuscitation (DNR)  Continuous    Question Answer Comment  In the event of cardiac or respiratory ARREST Do not call a "code blue"   In the event of cardiac or respiratory ARREST Do not perform Intubation, CPR, defibrillation or ACLS   In the event of cardiac or respiratory ARREST Use medication by any route, position, wound care, and other measures to relive pain and suffering. May use oxygen, suction and manual treatment of airway obstruction as needed for comfort.      10/30/18 2310        Code Status History    Date Active Date Inactive Code Status Order ID Comments User Context   11/07/2015 0624 11/09/2015 1622 DNR [id number]  Ihor AustinPyreddy, Pavan, MD Inpatient   11/07/2015 0526 11/07/2015 0624 Full Code [id number]  Ihor AustinPyreddy, Pavan, MD ED      Home/SNF/Other Home  Chief Complaint Lower Abd Pain  Level of Care/Admitting Diagnosis ED Disposition    ED Disposition Condition Comment   Admit  Hospital [facility] [[id number]]  Level of Care: Med-Surg [16]  Diagnosis: Sigmoid diverticulitis [[id number]][[id number]]  Admitting Physician: Barbaraann RondoSRIDHARAN, PRASANNA [[id number]]  Attending Physician: Barbaraann RondoSRIDHARAN, PRASANNA [[id number]]  Estimated length of stay: past midnight tomorrow  Certification:: I certify this patient will need inpatient services for at least 2 midnights  PT Class (Do Not Modify): Inpatient [101]  PT Acc Code (Do Not Modify): Private [1]       Medical History Past Medical History:  Diagnosis Date  . Diabetes mellitus without complication (HCC)   . History of heart attack   . Hyperlipidemia   . Hypertension   . PVC (premature ventricular contraction)   . Right bundle branch block   . Stroke [facility](HCC)     Allergies Allergies  Allergen Reactions  . Azithromycin    . Erythromycin Base     Other reaction(s): UNKNOWN  . Metformin And Related Other (See Comments)    unknown    IV Location/Drains/Wounds Patient Lines/Drains/Airways Status   Active Line/Drains/Airways    Name:   Placement date:   Placement time:   Site:   Days:   Peripheral IV 10/30/18 Right Forearm   10/30/18    2141    Forearm   less than 1   External Urinary Catheter   01/04/17    1027    -   664          Labs/Imaging Results for orders placed or performed during the hospital encounter of 10/30/18 (from the past 48 hour(s))  Lipase, blood     Status: None   Collection Time: 10/30/18  8:15 PM  Result Value Ref Range   Lipase 28 11 - 51 U/L    Comment: Performed at [facility], [address] [place], [place] [address]  Comprehensive metabolic panel     Status: Abnormal   Collection Time: 10/30/18  8:15 PM  Result Value Ref Range   Sodium 135 135 - 145 mmol/L   Potassium 4.4 3.5 - 5.1 mmol/L   Chloride 104 98 - 111 mmol/L   CO2 21 (L) 22 - 32 mmol/L   Glucose, Bld 243 (H) 70 - 99 mg/dL   BUN 31 (H) 8 - 23  mg/dL   Creatinine, Ser 2.82 0.61 - 1.24 mg/dL   Calcium 8.8 (L) 8.9 - 10.3 mg/dL   Total Protein 6.9 6.5 - 8.1 g/dL   Albumin 4.0 3.5 - 5.0 g/dL   AST 18 15 - 41 U/L   ALT 23 0 - 44 U/L   Alkaline Phosphatase 76 38 - 126 U/L   Total Bilirubin 1.7 (H) 0.3 - 1.2 mg/dL   GFR calc non Af Amer >60 >60 mL/min   GFR calc Af Amer >60 >60 mL/min   Anion gap 10 5 - 15    Comment: Performed at [facility], [address] [place], [place] [address]  CBC     Status: Abnormal   Collection Time: 10/30/18  8:15 PM  Result Value Ref Range   WBC 19.2 (H) 4.0 - 10.5 K/uL   RBC 4.76 4.22 - 5.81 MIL/uL   Hemoglobin 14.4 13.0 - 17.0 g/dL   HCT 61.5 37.9 - 43.2 %   MCV 89.7 80.0 - 100.0 fL   MCH 30.3 26.0 - 34.0 pg   MCHC 33.7 30.0 - 36.0 g/dL   RDW 76.1 47.0 - 92.9 %   Platelets 222 150 - 400 K/uL   nRBC 0.0 0.0 - 0.2 %    Comment: Performed at  [facility], [address] [place], [place] [address]  Urinalysis, Complete w Microscopic     Status: Abnormal   Collection Time: 10/30/18  8:15 PM  Result Value Ref Range   Color, Urine YELLOW (A) YELLOW   APPearance CLEAR (A) CLEAR   Specific Gravity, Urine 1.014 1.005 - 1.030   pH 5.0 5.0 - 8.0   Glucose, UA NEGATIVE NEGATIVE mg/dL   Hgb urine dipstick SMALL (A) NEGATIVE   Bilirubin Urine NEGATIVE NEGATIVE   Ketones, ur NEGATIVE NEGATIVE mg/dL   Protein, ur NEGATIVE NEGATIVE mg/dL   Nitrite NEGATIVE NEGATIVE   Leukocytes,Ua LARGE (A) NEGATIVE   RBC / HPF 11-20 0 - 5 RBC/hpf   WBC, UA 6-10 0 - 5 WBC/hpf   Bacteria, UA RARE (A) NONE SEEN   Squamous Epithelial / LPF 0-5 0 - 5   Mucus PRESENT     Comment: Performed at [facility], [address], [place], [place] [address]   Ct Abdomen Pelvis W Contrast  Result Date: 10/30/2018 CLINICAL DATA:  Lower abdominal pain starting this morning. EXAM: CT ABDOMEN AND PELVIS WITH CONTRAST TECHNIQUE: Multidetector CT imaging of the abdomen and pelvis was performed using the standard protocol following bolus administration of intravenous contrast. CONTRAST:  OMNIPAQUE IOHEXOL 300 MG/ML  SOLN COMPARISON:  CT abdomen dated 08/24/2018. FINDINGS: Lower chest: No acute abnormality. Hepatobiliary: Stable small cysts within the liver. Gallbladder appears normal, decompressed. No bile duct dilatation. Pancreas: Partially infiltrated with fat but otherwise unremarkable. Spleen: Normal in size without focal abnormality. Adrenals/Urinary Tract: Adrenal glands appear normal. Kidneys are unremarkable without mass, stone or hydronephrosis. No ureteral or bladder calculi identified. Stomach/Bowel: Focal thickening of the walls of the mid sigmoid colon, with associated pericolic inflammation, consistent with acute diverticulitis. Small collection of air is seen outside of the colon/diverticulum, consistent with perforation. Associated mild  fluid stranding. No abscess collection. No dilated large or small bowel loops. Appendix is not seen but there are no inflammatory changes in the RIGHT lower quadrant to suggest acute appendicitis. Stomach is unremarkable. Vascular/Lymphatic: Aortic atherosclerosis. No acute appearing vascular abnormality. No enlarged lymph nodes. Reproductive: Prostate is unremarkable. Other: No abscess collection seen. Musculoskeletal:  Degenerative spondylosis of the lumbar spine, moderate in degree. Additional degenerative changes at the bilateral hip joints, moderate in degree. Nondisplaced fracture line within the anterior third of the L1 vertebral body, of uncertain age but developed since the previous exam. IMPRESSION: 1. Acute diverticulitis of the mid sigmoid colon, with associated bowel wall thickening and mild paracolic inflammation. Small collection of air is seen outside of the colon/diverticulum, consistent with perforation, suspected micro perforation. No abscess collection seen. No associated bowel obstruction. 2. Nondisplaced fracture line within the anterior third of the L1 vertebral body, of uncertain age but developed since the previous exam. This could be acute or subacute, but favor subacute. Recommend correlation with physical exam findings and/or nuclear medicine bone scan. Aortic Atherosclerosis (ICD10-I70.0). Electronically Signed   By: Bary Alyssa M.D.   On: 10/30/2018 22:27    Pending Labs Unresulted Labs (From admission, onward)    Start     Ordered   10/30/18 2254  Calcium, ionized  Once,   STAT    Question:  Specimen collection method  Answer:  Unit=Unit collect   10/30/18 2253   10/30/18 2254  Procalcitonin - Baseline  Add-on,   AD    Question:  Specimen collection method  Answer:  Unit=Unit collect   10/30/18 2253   10/30/18 2254  Lactic acid, plasma  Once,   STAT    Question:  Specimen collection method  Answer:  Unit=Unit collect   10/30/18 2253   10/30/18 2253  Magnesium  Add-on,    AD    Question:  Specimen collection method  Answer:  Unit=Unit collect   10/30/18 2253   10/30/18 2253  Phosphorus  Add-on,   AD    Question:  Specimen collection method  Answer:  Unit=Unit collect   10/30/18 2253   Signed and Held  Basic metabolic panel  Tomorrow morning,   R     Signed and Held   Signed and Held  CBC  Tomorrow morning,   R     Signed and Held          Vitals/Pain Today's Vitals   10/30/18 2014 10/30/18 2142 10/30/18 2230 10/30/18 2257  BP: (!) 130/55 (!) 143/67 138/70 138/70  Pulse: 75 67 68 69  Resp: 18 18  18   Temp: 98.7 F (37.1 C)     TempSrc: Oral     SpO2: 98% 97% 97% 98%    Isolation Precautions No active isolations  Medications Medications  sodium chloride flush (NS) 0.9 % injection 3 mL (3 mLs Intravenous Not Given 10/30/18 2142)  piperacillin-tazobactam (ZOSYN) IVPB 3.375 g (has no administration in time range)  lactated ringers infusion (has no administration in time range)  morphine 2 MG/ML injection 2 mg (has no administration in time range)    Or  morphine 4 MG/ML injection 4 mg (has no administration in time range)  iopamidol (ISOVUE-300) 61 % injection 30 mL (30 mLs Oral Contrast Given 10/30/18 2142)  iohexol (OMNIPAQUE) 300 MG/ML solution 100 mL (100 mLs Intravenous Contrast Given 10/30/18 2206)  0.9 %  sodium chloride infusion (1,000 mLs Intravenous Transfusing/Transfer 10/30/18 2312)    Mobility walks with device

## 2018-10-30 NOTE — ED Notes (Signed)
Transport to floor room 211.AS

## 2018-10-30 NOTE — ED Provider Notes (Signed)
[facility] Emergency Department Provider Note   ____________________________________________    I have reviewed the triage vital signs and the nursing notes.   HISTORY  Chief Complaint Abdominal Pain    HPI Benjamin Parks is a 85 y.o. male who presents with complaints of abdominal pain.  Patient reports the pain is primarily periumbilical, he does have a history of diabetes.  He reports the pain started primarily this morning, is constant without radiation.  Does not take anything for this.  Denies fevers or chills.  No nausea or vomiting.  Normal bowel movement this morning.  No history of diverticulitis.  Does have a history of urinary tract infection, denies dysuria, no flank or back pain  Past Medical History:  Diagnosis Date  . Diabetes mellitus without complication (HCC)   . History of heart attack   . Hyperlipidemia   . Hypertension   . PVC (premature ventricular contraction)   . Right bundle branch block   . Stroke [facility])     Patient Active Problem List   Diagnosis Date Noted  . SOBOE (shortness of breath on exertion) 06/17/2018  . BPH with obstruction/lower urinary tract symptoms 01/01/2017  . Hematuria 01/01/2017  . Benign essential HTN 11/16/2015  . CAD (coronary artery disease) 11/16/2015  . Mixed hyperlipidemia 11/16/2015  . Non-STEMI (non-ST elevated myocardial infarction) (HCC) 11/07/2015  . Community acquired pneumonia 11/07/2015  . NSTEMI (non-ST elevated myocardial infarction) (HCC) 11/07/2015  . Chronic cystitis 08/17/2013  . Elevated PSA 08/17/2013  . Gross hematuria 08/16/2012  . Incomplete emptying of bladder 08/16/2012  . Nodular prostate with urinary obstruction 08/16/2012  . Retention of urine 08/16/2012  . Urinary tract infection 08/16/2012    Past Surgical History:  Procedure Laterality Date  . APPENDECTOMY    . CARDIAC CATHETERIZATION N/A 11/08/2015   Procedure: Left Heart Cath and Coronary Angiography;   Surgeon: Dalia Heading, MD;  Location: [facility] INVASIVE CV LAB;  Service: Cardiovascular;  Laterality: N/A;  . HERNIA REPAIR    . none    . TONSILLECTOMY      Prior to Admission medications   Medication Sig Start Date End Date Taking? Authorizing Provider  aspirin EC 81 MG EC tablet Take 1 tablet (81 mg total) by mouth daily. 11/09/15   Adrian Saran, MD  atorvastatin (LIPITOR) 80 MG tablet Take 0.5 tablets (40 mg total) by mouth daily at 6 PM. 11/09/15   Adrian Saran, MD  cefdinir (OMNICEF) 300 MG capsule Take 1 capsule (300 mg total) by mouth 2 (two) times daily. 03/19/17   Myrna Blazer, MD  citalopram (CELEXA) 20 MG tablet Take 20 mg by mouth daily.    [provider]  clopidogrel (PLAVIX) 75 MG tablet Take 1 tablet (75 mg total) by mouth daily. 11/09/15   Adrian Saran, MD  cyanocobalamin (,VITAMIN B-12,) 1000 MCG/ML injection Inject 1,000 mcg into the muscle every 30 (thirty) days.    [provider]  cyclobenzaprine (FLEXERIL) 5 MG tablet Take 1 tablet (5 mg total) by mouth 2 (two) times daily as needed for muscle spasms. 07/07/18   Charlynne Pander, MD  diclofenac sodium (VOLTAREN) 1 % GEL Apply 2 g topically 2 (two) times daily as needed for pain. 01/13/17   [provider]  finasteride (PROSCAR) 5 MG tablet Take 5 mg by mouth daily. 02/19/17   [provider]  glipiZIDE (GLUCOTROL) 5 MG tablet Take 0.5 tablets (2.5 mg total) by mouth daily before breakfast. 11/09/15  Adrian Saran, MD  Lancets (ACCU-CHEK MULTICLIX) lancets Use as instructed 11/09/15   Adrian Saran, MD  levofloxacin (LEVAQUIN) 500 MG tablet Take 1 tablet (500 mg total) by mouth daily. 11/09/15   Adrian Saran, MD  lidocaine (LIDODERM) 5 % Place 1 patch onto the skin daily. 10/18/16   Jennye Moccasin, MD  lisinopril (PRINIVIL,ZESTRIL) 2.5 MG tablet Take 1 tablet (2.5 mg total) by mouth daily. 11/09/15   Adrian Saran, MD  metoprolol tartrate (LOPRESSOR) 25 MG tablet Take 1 tablet (25 mg total) by  mouth 2 (two) times daily. 11/09/15   Adrian Saran, MD  nitroGLYCERIN (NITROSTAT) 0.4 MG SL tablet Place 1 tablet (0.4 mg total) under the tongue every 5 (five) minutes x 3 doses as needed for chest pain. 11/09/15   Adrian Saran, MD     Allergies Azithromycin; Erythromycin base; and Metformin and related  Family History  Problem Relation Age of Onset  . Heart attack Mother        deceased  . Heart attack Father        deceased    Social History Social History   Tobacco Use  . Smoking status: Former Games developer  . Smokeless tobacco: Never Used  Substance Use Topics  . Alcohol use: Yes    Alcohol/week: 0.0 standard drinks    Comment: one cocktail every day.  . Drug use: No    Review of Systems  Constitutional: No fever/chills Eyes: No visual changes.  ENT: No sore throat. Cardiovascular: Denies chest pain. Respiratory: Denies shortness of breath. Gastrointestinal: As above Genitourinary: Negative for dysuria.  No frequency Musculoskeletal: Negative for back pain. Skin: Negative for rash. Neurological: Negative for headaches   ____________________________________________   PHYSICAL EXAM:  VITAL SIGNS: ED Triage Vitals [10/30/18 2014]  Enc Vitals Group     BP (!) 130/55     Pulse Rate 75     Resp 18     Temp 98.7 F (37.1 C)     Temp Source Oral     SpO2 98 %     Weight      Height      Head Circumference      Peak Flow      Pain Score      Pain Loc      Pain Edu?      Excl. in GC?     Constitutional: Alert and oriented. No acute distress. Eyes: Conjunctivae are normal.   Nose: No congestion/rhinnorhea. Mouth/Throat: Mucous membranes are moist.    Cardiovascular: Normal rate, regular rhythm.   Good peripheral circulation. Respiratory: Normal respiratory effort.  No retractions.  Gastrointestinal: Tenderness palpation periumbilically, mild distention   No CVA tenderness.  Musculoskeletal: Warm and well perfused Neurologic:  Normal speech and language.  No gross focal neurologic deficits are appreciated.  Skin:  Skin is warm, dry and intact. No rash noted. Psychiatric: Mood and affect are normal. Speech and behavior are normal.  ____________________________________________   LABS (all labs ordered are listed, but only abnormal results are displayed)  Labs Reviewed  COMPREHENSIVE METABOLIC PANEL - Abnormal; Notable for the following components:      Result Value   CO2 21 (*)    Glucose, Bld 243 (*)    BUN 31 (*)    Calcium 8.8 (*)    Total Bilirubin 1.7 (*)    All other components within normal limits  CBC - Abnormal; Notable for the following components:   WBC 19.2 (*)    All other  components within normal limits  URINALYSIS, COMPLETE (UACMP) WITH MICROSCOPIC - Abnormal; Notable for the following components:   Color, Urine YELLOW (*)    APPearance CLEAR (*)    Hgb urine dipstick SMALL (*)    Leukocytes,Ua LARGE (*)    Bacteria, UA RARE (*)    All other components within normal limits  LIPASE, BLOOD   ____________________________________________  EKG  None ____________________________________________  RADIOLOGY  CT abdomen pelvis demonstrates sigmoid diverticulitis with microperforation ____________________________________________   PROCEDURES  Procedure(s) performed: No  Procedures   Critical Care performed: yes  CRITICAL CARE Performed by: Jene Everyobert Carah Barrientes   Total critical care time: 30 minutes  Critical care time was exclusive of separately billable procedures and treating other patients.  Critical care was necessary to treat or prevent imminent or life-threatening deterioration.  Critical care was time spent personally by me on the following activities: development of treatment plan with patient and/or surrogate as well as nursing, discussions with consultants, evaluation of patient's response to treatment, examination of patient, obtaining history from patient or surrogate, ordering and performing  treatments and interventions, ordering and review of laboratory studies, ordering and review of radiographic studies, pulse oximetry and re-evaluation of patient's condition.  ____________________________________________   INITIAL IMPRESSION / ASSESSMENT AND PLAN / ED COURSE  Pertinent labs & imaging results that were available during my care of the patient were reviewed by me and considered in my medical decision making (see chart for details).  Patient presents with complaints of a abdominal pain, he does have significant tenderness periumbilically, significantly elevated white blood cell count.  Pending CT abdomen pelvis.  Offered IV morphine IV Zofran patient declined  ----------------------------------------- 10:49 PM on 10/30/2018 -----------------------------------------  CT scan demonstrates sigmoid diverticulitis with microperforation, I have ordered IV Zosyn, IV fluids discussed with hospitalist for admission    ____________________________________________   FINAL CLINICAL IMPRESSION(S) / ED DIAGNOSES  Final diagnoses:  Perforation of sigmoid colon due to diverticulitis        Note:  This document was prepared using Dragon voice recognition software and may include unintentional dictation errors.   Jene EveryKinner, Daiden Coltrane, MD 10/30/18 2249

## 2018-10-31 ENCOUNTER — Other Ambulatory Visit: Payer: Self-pay

## 2018-10-31 LAB — CBC
HCT: 38.6 % — ABNORMAL LOW (ref 39.0–52.0)
HEMOGLOBIN: 13 g/dL (ref 13.0–17.0)
MCH: 30.6 pg (ref 26.0–34.0)
MCHC: 33.7 g/dL (ref 30.0–36.0)
MCV: 90.8 fL (ref 80.0–100.0)
Platelets: 201 10*3/uL (ref 150–400)
RBC: 4.25 MIL/uL (ref 4.22–5.81)
RDW: 13.2 % (ref 11.5–15.5)
WBC: 12.6 10*3/uL — ABNORMAL HIGH (ref 4.0–10.5)
nRBC: 0 % (ref 0.0–0.2)

## 2018-10-31 LAB — PROCALCITONIN: Procalcitonin: 1.38 ng/mL

## 2018-10-31 LAB — GLUCOSE, CAPILLARY
Glucose-Capillary: 188 mg/dL — ABNORMAL HIGH (ref 70–99)
Glucose-Capillary: 195 mg/dL — ABNORMAL HIGH (ref 70–99)
Glucose-Capillary: 215 mg/dL — ABNORMAL HIGH (ref 70–99)
Glucose-Capillary: 228 mg/dL — ABNORMAL HIGH (ref 70–99)
Glucose-Capillary: 242 mg/dL — ABNORMAL HIGH (ref 70–99)

## 2018-10-31 LAB — LACTIC ACID, PLASMA: Lactic Acid, Venous: 1.5 mmol/L (ref 0.5–1.9)

## 2018-10-31 MED ORDER — INSULIN ASPART 100 UNIT/ML ~~LOC~~ SOLN
0.0000 [IU] | Freq: Three times a day (TID) | SUBCUTANEOUS | Status: DC
  Administered 2018-10-31: 3 [IU] via SUBCUTANEOUS
  Administered 2018-10-31 – 2018-11-01 (×3): 5 [IU] via SUBCUTANEOUS
  Administered 2018-11-01: 3 [IU] via SUBCUTANEOUS
  Filled 2018-10-31 (×5): qty 1

## 2018-10-31 MED ORDER — BISACODYL 5 MG PO TBEC: 5.0000 mg | DELAYED_RELEASE_TABLET | Freq: Every day | ORAL | Status: DC | PRN

## 2018-10-31 MED ORDER — ACETAMINOPHEN 325 MG PO TABS: 650.0000 mg | ORAL_TABLET | Freq: Four times a day (QID) | ORAL | Status: DC | PRN

## 2018-10-31 MED ORDER — ENOXAPARIN SODIUM 40 MG/0.4ML ~~LOC~~ SOLN
40.0000 mg | SUBCUTANEOUS | Status: DC
  Administered 2018-10-31 – 2018-11-01 (×2): 40 mg via SUBCUTANEOUS
  Filled 2018-10-31 (×2): qty 0.4

## 2018-10-31 MED ORDER — SENNOSIDES-DOCUSATE SODIUM 8.6-50 MG PO TABS: 1.0000 | ORAL_TABLET | Freq: Every evening | ORAL | Status: DC | PRN

## 2018-10-31 MED ORDER — METOPROLOL TARTRATE 25 MG PO TABS
25.0000 mg | ORAL_TABLET | Freq: Two times a day (BID) | ORAL | Status: DC
  Administered 2018-10-31 – 2018-11-01 (×3): 25 mg via ORAL
  Filled 2018-10-31 (×3): qty 1

## 2018-10-31 MED ORDER — FINASTERIDE 5 MG PO TABS
5.0000 mg | ORAL_TABLET | Freq: Every day | ORAL | Status: DC
  Administered 2018-10-31 – 2018-11-01 (×2): 5 mg via ORAL
  Filled 2018-10-31 (×2): qty 1

## 2018-10-31 MED ORDER — CITALOPRAM HYDROBROMIDE 20 MG PO TABS
20.0000 mg | ORAL_TABLET | Freq: Every day | ORAL | Status: DC
  Administered 2018-10-31 – 2018-11-01 (×2): 20 mg via ORAL
  Filled 2018-10-31 (×2): qty 1

## 2018-10-31 MED ORDER — ONDANSETRON HCL 4 MG/2ML IJ SOLN: 4.0000 mg | Freq: Four times a day (QID) | INTRAMUSCULAR | Status: DC | PRN

## 2018-10-31 MED ORDER — ONDANSETRON HCL 4 MG PO TABS: 4.0000 mg | ORAL_TABLET | Freq: Four times a day (QID) | ORAL | Status: DC | PRN

## 2018-10-31 MED ORDER — ASPIRIN EC 81 MG PO TBEC
81.0000 mg | DELAYED_RELEASE_TABLET | Freq: Every day | ORAL | Status: DC
  Administered 2018-10-31 – 2018-11-01 (×2): 81 mg via ORAL
  Filled 2018-10-31 (×2): qty 1

## 2018-10-31 MED ORDER — CLOPIDOGREL BISULFATE 75 MG PO TABS
75.0000 mg | ORAL_TABLET | Freq: Every day | ORAL | Status: DC
  Administered 2018-10-31 – 2018-11-01 (×2): 75 mg via ORAL
  Filled 2018-10-31 (×2): qty 1

## 2018-10-31 MED ORDER — INSULIN ASPART 100 UNIT/ML ~~LOC~~ SOLN
0.0000 [IU] | Freq: Every day | SUBCUTANEOUS | Status: DC
  Administered 2018-10-31: 2 [IU] via SUBCUTANEOUS
  Filled 2018-10-31: qty 1

## 2018-10-31 MED ORDER — ATORVASTATIN CALCIUM 20 MG PO TABS
40.0000 mg | ORAL_TABLET | Freq: Every day | ORAL | Status: DC
  Administered 2018-10-31: 40 mg via ORAL
  Filled 2018-10-31: qty 2

## 2018-10-31 MED ORDER — ACETAMINOPHEN 650 MG RE SUPP: 650.0000 mg | Freq: Four times a day (QID) | RECTAL | Status: DC | PRN

## 2018-10-31 MED ORDER — LISINOPRIL 2.5 MG PO TABS
2.5000 mg | ORAL_TABLET | Freq: Every day | ORAL | Status: DC
  Administered 2018-10-31 – 2018-11-01 (×2): 2.5 mg via ORAL
  Filled 2018-10-31 (×2): qty 1

## 2018-10-31 MED ORDER — NITROGLYCERIN 0.4 MG SL SUBL: 0.4000 mg | SUBLINGUAL_TABLET | SUBLINGUAL | Status: DC | PRN

## 2018-10-31 NOTE — Progress Notes (Signed)
[facility] - [place] at [facility]   PATIENT NAME: Benjamin Parks    MR#:  [id number]  DATE OF BIRTH:  1918/09/17  SUBJECTIVE: Patient admitted for sigmoid colon diverticulitis, less abdominal pain, tolerating diet, no nausea, had normal BM this morning.  Sitting in the chair.  CHIEF COMPLAINT:   Chief Complaint  Patient presents with  . Abdominal Pain    REVIEW OF SYSTEMS:   ROS CONSTITUTIONAL: No fever, fatigue or weakness.  EYES: No blurred or double vision.  EARS, NOSE, AND THROAT: No tinnitus or ear pain.  RESPIRATORY: No cough, shortness of breath, wheezing or hemoptysis.  CARDIOVASCULAR: No chest pain, orthopnea, edema.  GASTROINTESTINAL: No nausea, vomiting, diarrhea or abdominal pain.  GENITOURINARY: No dysuria, hematuria.  ENDOCRINE: No polyuria, nocturia,  HEMATOLOGY: No anemia, easy bruising or bleeding SKIN: No rash or lesion. MUSCULOSKELETAL: No joint pain or arthritis.   NEUROLOGIC: No tingling, numbness, weakness.  PSYCHIATRY: No anxiety or depression.   DRUG ALLERGIES:   Allergies  Allergen Reactions  . Azithromycin   . Erythromycin Base     Other reaction(s): UNKNOWN  . Metformin And Related Other (See Comments)    unknown    VITALS:  Blood pressure (!) 111/52, pulse (!) 57, temperature 98.5 F (36.9 C), temperature source Oral, resp. rate 18, height 5\' 10"  (1.778 m), weight 79.9 kg, SpO2 95 %.  PHYSICAL EXAMINATION:  GENERAL:  85 y.o.-year-old patient lying in the bed with no acute distress.  EYES: Pupils equal, round, reactive to light and accommodation. No scleral icterus. Extraocular muscles intact.  HEENT: Head atraumatic, normocephalic. Oropharynx and nasopharynx clear.  NECK:  Supple, no jugular venous distention. No thyroid enlargement, no tenderness.  LUNGS: Normal breath sounds bilaterally, no wheezing, rales,rhonchi or crepitation. No use of accessory muscles of respiration.  CARDIOVASCULAR: S1, S2 normal. No  murmurs, rubs, or gallops.  ABDOMEN: Soft, nontender, nondistended. Bowel sounds present. No organomegaly or mass.  EXTREMITIES: No pedal edema, cyanosis, or clubbing.  NEUROLOGIC: Cranial nerves II through XII are intact. Muscle strength 5/5 in all extremities. Sensation intact. Gait not checked.  PSYCHIATRIC: The patient is alert and oriented x 3.  SKIN: No obvious rash, lesion, or ulcer.    LABORATORY PANEL:   CBC Recent Labs  Lab 10/30/18 2015  WBC 19.2*  HGB 14.4  HCT 42.7  PLT 222   ------------------------------------------------------------------------------------------------------------------  Chemistries  Recent Labs  Lab 10/30/18 2015  NA 135  K 4.4  CL 104  CO2 21*  GLUCOSE 243*  BUN 31*  CREATININE 0.98  CALCIUM 8.8*  MG 2.0  AST 18  ALT 23  ALKPHOS 76  BILITOT 1.7*   ------------------------------------------------------------------------------------------------------------------  Cardiac Enzymes No results for input(s): TROPONINI in the last 168 hours. ------------------------------------------------------------------------------------------------------------------  RADIOLOGY:  Ct Abdomen Pelvis W Contrast  Result Date: 10/30/2018 CLINICAL DATA:  Lower abdominal pain starting this morning. EXAM: CT ABDOMEN AND PELVIS WITH CONTRAST TECHNIQUE: Multidetector CT imaging of the abdomen and pelvis was performed using the standard protocol following bolus administration of intravenous contrast. CONTRAST:  OMNIPAQUE IOHEXOL 300 MG/ML  SOLN COMPARISON:  CT abdomen dated 08/24/2018. FINDINGS: Lower chest: No acute abnormality. Hepatobiliary: Stable small cysts within the liver. Gallbladder appears normal, decompressed. No bile duct dilatation. Pancreas: Partially infiltrated with fat but otherwise unremarkable. Spleen: Normal in size without focal abnormality. Adrenals/Urinary Tract: Adrenal glands appear normal. Kidneys are unremarkable without mass, stone  or hydronephrosis. No ureteral or bladder calculi identified. Stomach/Bowel: Focal thickening of the  walls of the mid sigmoid colon, with associated pericolic inflammation, consistent with acute diverticulitis. Small collection of air is seen outside of the colon/diverticulum, consistent with perforation. Associated mild fluid stranding. No abscess collection. No dilated large or small bowel loops. Appendix is not seen but there are no inflammatory changes in the RIGHT lower quadrant to suggest acute appendicitis. Stomach is unremarkable. Vascular/Lymphatic: Aortic atherosclerosis. No acute appearing vascular abnormality. No enlarged lymph nodes. Reproductive: Prostate is unremarkable. Other: No abscess collection seen. Musculoskeletal: Degenerative spondylosis of the lumbar spine, moderate in degree. Additional degenerative changes at the bilateral hip joints, moderate in degree. Nondisplaced fracture line within the anterior third of the L1 vertebral body, of uncertain age but developed since the previous exam. IMPRESSION: 1. Acute diverticulitis of the mid sigmoid colon, with associated bowel wall thickening and mild paracolic inflammation. Small collection of air is seen outside of the colon/diverticulum, consistent with perforation, suspected micro perforation. No abscess collection seen. No associated bowel obstruction. 2. Nondisplaced fracture line within the anterior third of the L1 vertebral body, of uncertain age but developed since the previous exam. This could be acute or subacute, but favor subacute. Recommend correlation with physical exam findings and/or nuclear medicine bone scan. Aortic Atherosclerosis (ICD10-I70.0). Electronically Signed   By: Bary Aamir M.D.   On: 10/30/2018 22:27    EKG:   Orders placed or performed in visit on 07/07/18  . EKG 12-Lead    ASSESSMENT AND PLAN:   #1. sepsis present on admission secondary to sigmoid colon diverticulitis with microperforation: Evidence  of elevated lactic acid, elevated white count on admission: Continue IV antibiotics for today, watch for another 24 hours he is feeling much better, tolerating diet, less abdominal pain. #2. diabetes mellitus type 2: Continue sliding scale insulin with coverage. 3..  History of stroke, patient already on aspirin, Plavix, statins: Continue them.  #4. CODE STATUS DNR Possible discharge tomorrow home.   All the records are reviewed and case discussed with Care Management/Social Workerr. Management plans discussed with the patient, family and they are in agreement.  CODE STATUS: DNR  TOTAL TIME TAKING CARE OF THIS PATIENT: 40 minutes.  More than 50% time spent in counseling, coordination of care.  POSSIBLE D/C IN 1-2 DAYS, DEPENDING ON CLINICAL CONDITION.   Katha Hamming M.D on 10/31/2018 at 10:21 AM  Between 7am to 6pm - Pager - [phone]  After 6pm go to [email or web address] - password [email or web address]  [facility]  Office  [phone]  CC: Primary care physician; Jerl Mina, MD   Note: This dictation was prepared with Dragon dictation along with smaller phrase technology. Any transcriptional errors that result from this process are unintentional.

## 2018-11-01 LAB — CBC
HCT: 34.1 % — ABNORMAL LOW (ref 39.0–52.0)
Hemoglobin: 11.4 g/dL — ABNORMAL LOW (ref 13.0–17.0)
MCH: 30.5 pg (ref 26.0–34.0)
MCHC: 33.4 g/dL (ref 30.0–36.0)
MCV: 91.2 fL (ref 80.0–100.0)
Platelets: 169 10*3/uL (ref 150–400)
RBC: 3.74 MIL/uL — ABNORMAL LOW (ref 4.22–5.81)
RDW: 13 % (ref 11.5–15.5)
WBC: 10.6 10*3/uL — ABNORMAL HIGH (ref 4.0–10.5)
nRBC: 0 % (ref 0.0–0.2)

## 2018-11-01 LAB — GLUCOSE, CAPILLARY
Glucose-Capillary: 199 mg/dL — ABNORMAL HIGH (ref 70–99)
Glucose-Capillary: 205 mg/dL — ABNORMAL HIGH (ref 70–99)
Glucose-Capillary: 164 mg/dL — ABNORMAL HIGH (ref 70–99)

## 2018-11-01 LAB — BASIC METABOLIC PANEL
Anion gap: 6 (ref 5–15)
BUN: 28 mg/dL — ABNORMAL HIGH (ref 8–23)
CALCIUM: 8 mg/dL — AB (ref 8.9–10.3)
CO2: 24 mmol/L (ref 22–32)
Chloride: 105 mmol/L (ref 98–111)
Creatinine, Ser: 0.93 mg/dL (ref 0.61–1.24)
GFR calc Af Amer: >60 mL/min (ref >60)
Glucose, Bld: 222 mg/dL — ABNORMAL HIGH (ref 70–99)
Potassium: 4.1 mmol/L (ref 3.5–5.1)
Sodium: 135 mmol/L (ref 135–145)

## 2018-11-01 LAB — CALCIUM, IONIZED: Calcium, Ionized, Serum: 4.6 mg/dL (ref 4.5–5.6)

## 2018-11-01 MED ORDER — AMOXICILLIN-POT CLAVULANATE 875-125 MG PO TABS: 1.0000 | ORAL_TABLET | Freq: Two times a day (BID) | ORAL | 0 refills | Status: DC

## 2018-11-01 MED ORDER — SODIUM CHLORIDE 0.9 % IV SOLN
INTRAVENOUS | Status: DC | PRN
  Administered 2018-11-01: 02:00:00 via INTRAVENOUS

## 2018-11-01 NOTE — Discharge Summary (Signed)
Benjamin Parks, is a 85 y.o. male  DOB 10-17-1918  MRN [id number].  Admission date:  10/30/2018  Admitting Physician  Barbaraann Rondo, MD  Discharge Date:  11/01/2018   Primary MD  Jerl Mina, MD  Recommendations for primary care physician for things to follow:   Follow-up with PCP in 1 week   Admission Diagnosis  Perforation of sigmoid colon due to diverticulitis [K57.20]   Discharge Diagnosis  Perforation of sigmoid colon due to diverticulitis [K57.20]   Active Problems:   Sigmoid diverticulitis      Past Medical History:  Diagnosis Date  . Diabetes mellitus without complication (HCC)   . History of heart attack   . Hyperlipidemia   . Hypertension   . PVC (premature ventricular contraction)   . Right bundle branch block   . Stroke [facility])     Past Surgical History:  Procedure Laterality Date  . APPENDECTOMY    . CARDIAC CATHETERIZATION N/A 11/08/2015   Procedure: Left Heart Cath and Coronary Angiography;  Surgeon: Dalia Heading, MD;  Location: [facility] INVASIVE CV LAB;  Service: Cardiovascular;  Laterality: N/A;  . HERNIA REPAIR    . none    . TONSILLECTOMY         History of present illness and  Hospital Course:     Kindly see H&P for history of present illness and admission details, please review complete Labs, Consult reports and Test reports for all details in brief  HPI  from the history and physical done on the day of admission 85 year old male patient admitted for abdominal pain, diarrhea, found to have sigmoid colon diverticulitis, admitted for the same.   Hospital Course  1.Left lower quadrant abdominal pain on the left side secondary to sigmoid colon diverticulitis with suspected micro perforation: Improved with conservative treatment with IV antibiotics, patient abdominal pain resolved,  tolerating regular diet without any diarrhea or nausea.  Eager to go back to [facility].  Sepsis present on admission secondary to sigmoid colon diverticulitis, resolved, patient had elevated lactic acid, procalcitonin, electrolyte count up to 19.2 on admission but resolved with antibiotics, patient's WBC 10.6 today Stable for discharge, patient can continue Augmentin for 7 days at discharge 3.  Diabetes mellitus type 2: Continue the present at discharge 4.  History of stroke, patient tolerated aspirin, Plavix, statins: Continue them.  Discharge Condition: Stable   Follow UP      Discharge Instructions  and  Discharge Medications    Allergies as of 11/01/2018      Reactions   Azithromycin    Erythromycin Base    Other reaction(s): UNKNOWN   Metformin And Related Other (See Comments)   unknown      Medication List    TAKE these medications   accu-chek multiclix lancets Use as instructed   amoxicillin-clavulanate 875-125 MG tablet Commonly known as:  AUGMENTIN Take 1 tablet by mouth 2 (two) times daily for 7 days.   ARTIFICIAL TEARS 0.1-0.3 % Soln Generic drug:  Dextran 70-Hypromellose Apply to eye.   aspirin 81 MG EC tablet Take 1 tablet (81 mg total) by mouth daily.   atorvastatin 80 MG tablet Commonly known as:  LIPITOR Take 0.5 tablets (40 mg total) by mouth daily at 6 PM.   clopidogrel 75 MG tablet Commonly known as:  PLAVIX Take 1 tablet (75 mg total) by mouth daily.   cyanocobalamin 1000 MCG/ML injection Commonly known as:  (VITAMIN B-12) Inject 1,000 mcg into the muscle every 30 (thirty) days.  diclofenac sodium 1 % Gel Commonly known as:  VOLTAREN Apply 2 g topically 2 (two) times daily as needed for pain.   finasteride 5 MG tablet Commonly known as:  PROSCAR Take 5 mg by mouth daily.   glipiZIDE 5 MG tablet Commonly known as:  GLUCOTROL Take 0.5 tablets (2.5 mg total) by mouth daily before breakfast.   lisinopril 2.5 MG tablet Commonly  known as:  PRINIVIL,ZESTRIL Take 1 tablet (2.5 mg total) by mouth daily.   metoprolol tartrate 25 MG tablet Commonly known as:  LOPRESSOR Take 1 tablet (25 mg total) by mouth 2 (two) times daily. What changed:  when to take this   nitroGLYCERIN 0.4 MG SL tablet Commonly known as:  NITROSTAT Place 1 tablet (0.4 mg total) under the tongue every 5 (five) minutes x 3 doses as needed for chest pain.   PRESERVISION AREDS Caps Take 1 capsule by mouth daily.   triamcinolone cream 0.1 % Commonly known as:  KENALOG APPLY TO AFFECTED AREA TWICE A DAY         Diet and Activity recommendation: See Discharge Instructions above   Consults obtained -none   Major procedures and Radiology Reports - PLEASE review detailed and final reports for all details, in brief -      Ct Abdomen Pelvis W Contrast  Result Date: 10/30/2018 CLINICAL DATA:  Lower abdominal pain starting this morning. EXAM: CT ABDOMEN AND PELVIS WITH CONTRAST TECHNIQUE: Multidetector CT imaging of the abdomen and pelvis was performed using the standard protocol following bolus administration of intravenous contrast. CONTRAST:  OMNIPAQUE IOHEXOL 300 MG/ML  SOLN COMPARISON:  CT abdomen dated 08/24/2018. FINDINGS: Lower chest: No acute abnormality. Hepatobiliary: Stable small cysts within the liver. Gallbladder appears normal, decompressed. No bile duct dilatation. Pancreas: Partially infiltrated with fat but otherwise unremarkable. Spleen: Normal in size without focal abnormality. Adrenals/Urinary Tract: Adrenal glands appear normal. Kidneys are unremarkable without mass, stone or hydronephrosis. No ureteral or bladder calculi identified. Stomach/Bowel: Focal thickening of the walls of the mid sigmoid colon, with associated pericolic inflammation, consistent with acute diverticulitis. Small collection of air is seen outside of the colon/diverticulum, consistent with perforation. Associated mild fluid stranding. No abscess  collection. No dilated large or small bowel loops. Appendix is not seen but there are no inflammatory changes in the RIGHT lower quadrant to suggest acute appendicitis. Stomach is unremarkable. Vascular/Lymphatic: Aortic atherosclerosis. No acute appearing vascular abnormality. No enlarged lymph nodes. Reproductive: Prostate is unremarkable. Other: No abscess collection seen. Musculoskeletal: Degenerative spondylosis of the lumbar spine, moderate in degree. Additional degenerative changes at the bilateral hip joints, moderate in degree. Nondisplaced fracture line within the anterior third of the L1 vertebral body, of uncertain age but developed since the previous exam. IMPRESSION: 1. Acute diverticulitis of the mid sigmoid colon, with associated bowel wall thickening and mild paracolic inflammation. Small collection of air is seen outside of the colon/diverticulum, consistent with perforation, suspected micro perforation. No abscess collection seen. No associated bowel obstruction. 2. Nondisplaced fracture line within the anterior third of the L1 vertebral body, of uncertain age but developed since the previous exam. This could be acute or subacute, but favor subacute. Recommend correlation with physical exam findings and/or nuclear medicine bone scan. Aortic Atherosclerosis (ICD10-I70.0). Electronically Signed   By: Bary Kareen M.D.   On: 10/30/2018 22:27    Micro Results     No results found for this or any previous visit (from the past 240 hour(s)).     Today  Subjective:   Benjamin Parks today has no headache,no chest abdominal pain,no new weakness tingling or numbness, feels much better wants to go home today.   Objective:   Blood pressure 136/62, pulse (!) 54, temperature 98.2 F (36.8 C), temperature source Oral, resp. rate 18, height 5\' 10"  (1.778 m), weight 79.9 kg, SpO2 95 %.   Intake/Output Summary (Last 24 hours) at 11/01/2018 0832 Last data filed at 11/01/2018 0657 Gross per  24 hour  Intake 543.39 ml  Output -  Net 543.39 ml    Exam Awake Alert, Oriented x 3, No new F.N deficits, Normal affect [place].AT,PERRAL Supple Neck,No JVD, No cervical lymphadenopathy appriciated.  Symmetrical Chest wall movement, Good air movement bilaterally, CTAB RRR,No Gallops,Rubs or new Murmurs, No Parasternal Heave +ve B.Sounds, Abd Soft, Non tender, No organomegaly appriciated, No rebound -guarding or rigidity. No Cyanosis, Clubbing or edema, No new Rash or bruise  Data Review   CBC w Diff:  Lab Results  Component Value Date   WBC 10.6 (H) 11/01/2018   HGB 11.4 (L) 11/01/2018   HGB 14.1 12/08/2013   HCT 34.1 (L) 11/01/2018   HCT 41.6 12/08/2013   PLT 169 11/01/2018   PLT 180 12/08/2013   LYMPHOPCT 17 07/07/2018   LYMPHOPCT 4.1 12/05/2012   MONOPCT 11 07/07/2018   MONOPCT 8.9 12/05/2012   EOSPCT 3 07/07/2018   EOSPCT 0.1 12/05/2012   BASOPCT 1 07/07/2018   BASOPCT 0.3 12/05/2012    CMP:  Lab Results  Component Value Date   NA 135 11/01/2018   NA 137 12/08/2013   K 4.1 11/01/2018   K 4.2 12/08/2013   CL 105 11/01/2018   CL 103 12/08/2013   CO2 24 11/01/2018   CO2 28 12/08/2013   BUN 28 (H) 11/01/2018   BUN 15 12/08/2013   CREATININE 0.93 11/01/2018   CREATININE 1.08 12/08/2013   PROT 6.9 10/30/2018   PROT 7.1 12/08/2013   ALBUMIN 4.0 10/30/2018   ALBUMIN 3.7 12/08/2013   BILITOT 1.7 (H) 10/30/2018   BILITOT 0.8 12/08/2013   ALKPHOS 76 10/30/2018   ALKPHOS 73 12/08/2013   AST 18 10/30/2018   AST 21 12/08/2013   ALT 23 10/30/2018   ALT 20 12/08/2013  .   Total Time in preparing paper work, data evaluation and todays exam - 35 minutes  Katha HammingSnehalatha Genean Adamski M.D on 11/01/2018 at 8:32 AM    Note: This dictation was prepared with Dragon dictation along with smaller phrase technology. Any transcriptional errors that result from this process are unintentional.

## 2018-11-01 NOTE — Care Management (Signed)
Reported patient resides at [facility].  Per nursing patient is independent and on RA.  No RNCM needs identified

## 2018-11-04 ENCOUNTER — Inpatient Hospital Stay
Admission: EM | Admit: 2018-11-04 | Discharge: 2018-11-12 | DRG: 392 | Disposition: A | Discharge status: Skilled Nursing Facility | Payer: Medicare Other | Attending: Surgery | Admitting: Surgery

## 2018-11-04 ENCOUNTER — Other Ambulatory Visit: Payer: Self-pay

## 2018-11-04 ENCOUNTER — Emergency Department: Payer: Medicare Other

## 2018-11-04 ENCOUNTER — Encounter: Payer: Self-pay | Admitting: *Deleted

## 2018-11-04 DIAGNOSIS — E785 Hyperlipidemia, unspecified: Secondary | ICD-10-CM | POA: Diagnosis present

## 2018-11-04 DIAGNOSIS — E119 Type 2 diabetes mellitus without complications: Secondary | ICD-10-CM | POA: Diagnosis present

## 2018-11-04 DIAGNOSIS — Z8249 Family history of ischemic heart disease and other diseases of the circulatory system: Secondary | ICD-10-CM | POA: Diagnosis not present

## 2018-11-04 DIAGNOSIS — K632 Fistula of intestine: Secondary | ICD-10-CM | POA: Diagnosis not present

## 2018-11-04 DIAGNOSIS — B9689 Other specified bacterial agents as the cause of diseases classified elsewhere: Secondary | ICD-10-CM | POA: Diagnosis not present

## 2018-11-04 DIAGNOSIS — Z7902 Long term (current) use of antithrombotics/antiplatelets: Secondary | ICD-10-CM | POA: Diagnosis not present

## 2018-11-04 DIAGNOSIS — Z8673 Personal history of transient ischemic attack (TIA), and cerebral infarction without residual deficits: Secondary | ICD-10-CM | POA: Diagnosis not present

## 2018-11-04 DIAGNOSIS — Z7984 Long term (current) use of oral hypoglycemic drugs: Secondary | ICD-10-CM | POA: Diagnosis not present

## 2018-11-04 DIAGNOSIS — E875 Hyperkalemia: Secondary | ICD-10-CM | POA: Diagnosis present

## 2018-11-04 DIAGNOSIS — Z888 Allergy status to other drugs, medicaments and biological substances status: Secondary | ICD-10-CM

## 2018-11-04 DIAGNOSIS — I4510 Unspecified right bundle-branch block: Secondary | ICD-10-CM | POA: Diagnosis present

## 2018-11-04 DIAGNOSIS — I252 Old myocardial infarction: Secondary | ICD-10-CM | POA: Diagnosis not present

## 2018-11-04 DIAGNOSIS — I2510 Atherosclerotic heart disease of native coronary artery without angina pectoris: Secondary | ICD-10-CM | POA: Diagnosis present

## 2018-11-04 DIAGNOSIS — E440 Moderate protein-calorie malnutrition: Secondary | ICD-10-CM

## 2018-11-04 DIAGNOSIS — I10 Essential (primary) hypertension: Secondary | ICD-10-CM | POA: Diagnosis present

## 2018-11-04 DIAGNOSIS — R739 Hyperglycemia, unspecified: Secondary | ICD-10-CM

## 2018-11-04 DIAGNOSIS — Z7982 Long term (current) use of aspirin: Secondary | ICD-10-CM | POA: Diagnosis not present

## 2018-11-04 DIAGNOSIS — Z95828 Presence of other vascular implants and grafts: Secondary | ICD-10-CM | POA: Diagnosis not present

## 2018-11-04 DIAGNOSIS — K5732 Diverticulitis of large intestine without perforation or abscess without bleeding: Secondary | ICD-10-CM

## 2018-11-04 DIAGNOSIS — Z1624 Resistance to multiple antibiotics: Secondary | ICD-10-CM | POA: Diagnosis not present

## 2018-11-04 DIAGNOSIS — Z881 Allergy status to other antibiotic agents status: Secondary | ICD-10-CM

## 2018-11-04 DIAGNOSIS — Z87891 Personal history of nicotine dependence: Secondary | ICD-10-CM

## 2018-11-04 DIAGNOSIS — Z1623 Resistance to quinolones and fluoroquinolones: Secondary | ICD-10-CM | POA: Diagnosis present

## 2018-11-04 DIAGNOSIS — K5720 Diverticulitis of large intestine with perforation and abscess without bleeding: Secondary | ICD-10-CM | POA: Diagnosis present

## 2018-11-04 DIAGNOSIS — B9620 Unspecified Escherichia coli [E. coli] as the cause of diseases classified elsewhere: Secondary | ICD-10-CM | POA: Diagnosis not present

## 2018-11-04 DIAGNOSIS — Z978 Presence of other specified devices: Secondary | ICD-10-CM | POA: Diagnosis not present

## 2018-11-04 DIAGNOSIS — E871 Hypo-osmolality and hyponatremia: Secondary | ICD-10-CM

## 2018-11-04 DIAGNOSIS — B961 Klebsiella pneumoniae [K. pneumoniae] as the cause of diseases classified elsewhere: Secondary | ICD-10-CM | POA: Diagnosis not present

## 2018-11-04 DIAGNOSIS — Z79899 Other long term (current) drug therapy: Secondary | ICD-10-CM

## 2018-11-04 LAB — LACTIC ACID, PLASMA
Lactic Acid, Venous: 1.5 mmol/L (ref 0.5–1.9)
Lactic Acid, Venous: 1.2 mmol/L (ref 0.5–1.9)

## 2018-11-04 LAB — URINALYSIS, COMPLETE (UACMP) WITH MICROSCOPIC
Bilirubin Urine: NEGATIVE
Glucose, UA: NEGATIVE mg/dL
Ketones, ur: NEGATIVE mg/dL
Nitrite: NEGATIVE
Protein, ur: NEGATIVE mg/dL
RBC / HPF: >50 RBC/hpf — ABNORMAL HIGH (ref 0–5)
SPECIFIC GRAVITY, URINE: 1.019 (ref 1.005–1.030)
pH: 5 (ref 5.0–8.0)

## 2018-11-04 LAB — COMPREHENSIVE METABOLIC PANEL
ALT: 24 U/L (ref 0–44)
AST: 18 U/L (ref 15–41)
Albumin: 3.5 g/dL (ref 3.5–5.0)
Alkaline Phosphatase: 76 U/L (ref 38–126)
Anion gap: 9 (ref 5–15)
BILIRUBIN TOTAL: 1.1 mg/dL (ref 0.3–1.2)
BUN: 21 mg/dL (ref 8–23)
CALCIUM: 8.6 mg/dL — AB (ref 8.9–10.3)
CO2: 23 mmol/L (ref 22–32)
Chloride: 101 mmol/L (ref 98–111)
Creatinine, Ser: 0.97 mg/dL (ref 0.61–1.24)
GFR calc Af Amer: >60 mL/min (ref >60)
Glucose, Bld: 219 mg/dL — ABNORMAL HIGH (ref 70–99)
Potassium: 5.2 mmol/L — ABNORMAL HIGH (ref 3.5–5.1)
Sodium: 133 mmol/L — ABNORMAL LOW (ref 135–145)
TOTAL PROTEIN: 6.7 g/dL (ref 6.5–8.1)

## 2018-11-04 LAB — GLUCOSE, CAPILLARY: Glucose-Capillary: 147 mg/dL — ABNORMAL HIGH (ref 70–99)

## 2018-11-04 LAB — CBC
HCT: 40.7 % (ref 39.0–52.0)
Hemoglobin: 13.5 g/dL (ref 13.0–17.0)
MCH: 30.5 pg (ref 26.0–34.0)
MCHC: 33.2 g/dL (ref 30.0–36.0)
MCV: 92.1 fL (ref 80.0–100.0)
Platelets: 259 10*3/uL (ref 150–400)
RBC: 4.42 MIL/uL (ref 4.22–5.81)
RDW: 12.9 % (ref 11.5–15.5)
WBC: 13 10*3/uL — AB (ref 4.0–10.5)
nRBC: 0 % (ref 0.0–0.2)

## 2018-11-04 MED ORDER — METRONIDAZOLE IN NACL 5-0.79 MG/ML-% IV SOLN
500.0000 mg | Freq: Once | INTRAVENOUS | Status: AC
  Administered 2018-11-04: 500 mg via INTRAVENOUS
  Filled 2018-11-04: qty 100

## 2018-11-04 MED ORDER — TRAMADOL HCL 50 MG PO TABS
50.0000 mg | ORAL_TABLET | Freq: Four times a day (QID) | ORAL | Status: DC | PRN
  Administered 2018-11-06 – 2018-11-09 (×5): 50 mg via ORAL
  Filled 2018-11-04 (×5): qty 1

## 2018-11-04 MED ORDER — TRIAMCINOLONE ACETONIDE 0.1 % EX CREA
TOPICAL_CREAM | Freq: Two times a day (BID) | CUTANEOUS | Status: DC | PRN
  Filled 2018-11-04: qty 15

## 2018-11-04 MED ORDER — DEXTROSE 50 % IV SOLN: 12.5000 g | INTRAVENOUS | Status: DC

## 2018-11-04 MED ORDER — LACTATED RINGERS IV SOLN
INTRAVENOUS | Status: DC
  Administered 2018-11-04 – 2018-11-08 (×6): via INTRAVENOUS

## 2018-11-04 MED ORDER — CIPROFLOXACIN IN D5W 400 MG/200ML IV SOLN
400.0000 mg | Freq: Once | INTRAVENOUS | Status: AC
  Administered 2018-11-04: 400 mg via INTRAVENOUS
  Filled 2018-11-04: qty 200

## 2018-11-04 MED ORDER — METOPROLOL TARTRATE 25 MG PO TABS
25.0000 mg | ORAL_TABLET | Freq: Every day | ORAL | Status: DC
  Administered 2018-11-06 – 2018-11-12 (×7): 25 mg via ORAL
  Filled 2018-11-04 (×8): qty 1

## 2018-11-04 MED ORDER — SODIUM CHLORIDE 0.9 % IV SOLN
1.0000 g | Freq: Two times a day (BID) | INTRAVENOUS | Status: DC
  Administered 2018-11-04 – 2018-11-06 (×4): 1 g via INTRAVENOUS
  Filled 2018-11-04 (×5): qty 1

## 2018-11-04 MED ORDER — DEXTROSE 50 % IV SOLN
12.5000 g | INTRAVENOUS | Status: DC | PRN
  Administered 2018-11-06: 12.5 g via INTRAVENOUS
  Filled 2018-11-04: qty 50

## 2018-11-04 MED ORDER — SODIUM CHLORIDE 0.9 % IV BOLUS
500.0000 mL | Freq: Once | INTRAVENOUS | Status: AC
  Administered 2018-11-04: 500 mL via INTRAVENOUS

## 2018-11-04 MED ORDER — ACETAMINOPHEN 325 MG PO TABS
650.0000 mg | ORAL_TABLET | Freq: Four times a day (QID) | ORAL | Status: DC
  Administered 2018-11-04 – 2018-11-09 (×13): 650 mg via ORAL
  Filled 2018-11-04 (×14): qty 2

## 2018-11-04 MED ORDER — INSULIN ASPART 100 UNIT/ML ~~LOC~~ SOLN
0.0000 [IU] | Freq: Three times a day (TID) | SUBCUTANEOUS | Status: DC
  Administered 2018-11-05: 3 [IU] via SUBCUTANEOUS
  Administered 2018-11-07 – 2018-11-08 (×2): 2 [IU] via SUBCUTANEOUS
  Administered 2018-11-08: 3 [IU] via SUBCUTANEOUS
  Administered 2018-11-08: 5 [IU] via SUBCUTANEOUS
  Administered 2018-11-09 (×2): 2 [IU] via SUBCUTANEOUS
  Administered 2018-11-09: 5 [IU] via SUBCUTANEOUS
  Administered 2018-11-10 (×2): 3 [IU] via SUBCUTANEOUS
  Administered 2018-11-11: 2 [IU] via SUBCUTANEOUS
  Administered 2018-11-11: 3 [IU] via SUBCUTANEOUS
  Administered 2018-11-12: 5 [IU] via SUBCUTANEOUS
  Filled 2018-11-04 (×13): qty 1

## 2018-11-04 MED ORDER — ONDANSETRON HCL 4 MG/2ML IJ SOLN: 4.0000 mg | Freq: Four times a day (QID) | INTRAMUSCULAR | Status: DC | PRN

## 2018-11-04 MED ORDER — DICLOFENAC SODIUM 1 % TD GEL
2.0000 g | Freq: Two times a day (BID) | TRANSDERMAL | Status: DC | PRN
  Filled 2018-11-04: qty 100

## 2018-11-04 MED ORDER — PANTOPRAZOLE SODIUM 40 MG IV SOLR
40.0000 mg | Freq: Every day | INTRAVENOUS | Status: DC
  Administered 2018-11-04 – 2018-11-11 (×8): 40 mg via INTRAVENOUS
  Filled 2018-11-04 (×8): qty 40

## 2018-11-04 MED ORDER — FINASTERIDE 5 MG PO TABS
5.0000 mg | ORAL_TABLET | Freq: Every day | ORAL | Status: DC
  Administered 2018-11-05 – 2018-11-12 (×8): 5 mg via ORAL
  Filled 2018-11-04 (×8): qty 1

## 2018-11-04 MED ORDER — LISINOPRIL 2.5 MG PO TABS
2.5000 mg | ORAL_TABLET | Freq: Every day | ORAL | Status: DC
  Administered 2018-11-05 – 2018-11-12 (×8): 2.5 mg via ORAL
  Filled 2018-11-04 (×9): qty 1

## 2018-11-04 MED ORDER — GLIPIZIDE 5 MG PO TABS
2.5000 mg | ORAL_TABLET | Freq: Every day | ORAL | Status: DC
  Administered 2018-11-05 – 2018-11-06 (×2): 2.5 mg via ORAL
  Filled 2018-11-04 (×2): qty 0.5

## 2018-11-04 MED ORDER — MORPHINE SULFATE (PF) 2 MG/ML IV SOLN
2.0000 mg | INTRAVENOUS | Status: DC | PRN
  Administered 2018-11-05 (×2): 2 mg via INTRAVENOUS
  Filled 2018-11-04 (×2): qty 1

## 2018-11-04 MED ORDER — ATORVASTATIN CALCIUM 20 MG PO TABS
40.0000 mg | ORAL_TABLET | Freq: Every day | ORAL | Status: DC
  Administered 2018-11-05 – 2018-11-11 (×6): 40 mg via ORAL
  Filled 2018-11-04 (×6): qty 2

## 2018-11-04 MED ORDER — IOPAMIDOL (ISOVUE-300) INJECTION 61%
100.0000 mL | Freq: Once | INTRAVENOUS | Status: AC | PRN
  Administered 2018-11-04: 100 mL via INTRAVENOUS

## 2018-11-04 MED ORDER — SODIUM CHLORIDE 0.9 % IV BOLUS: 500.0000 mL | Freq: Once | INTRAVENOUS | Status: DC

## 2018-11-04 MED ORDER — ONDANSETRON 4 MG PO TBDP: 4.0000 mg | ORAL_TABLET | Freq: Four times a day (QID) | ORAL | Status: DC | PRN

## 2018-11-04 MED ORDER — DOCUSATE SODIUM 100 MG PO CAPS: 100.0000 mg | ORAL_CAPSULE | Freq: Two times a day (BID) | ORAL | Status: DC | PRN

## 2018-11-04 MED ORDER — OCUVITE-LUTEIN PO CAPS
1.0000 | ORAL_CAPSULE | Freq: Every day | ORAL | Status: DC
  Administered 2018-11-05 – 2018-11-12 (×8): 1 via ORAL
  Filled 2018-11-04 (×9): qty 1

## 2018-11-04 NOTE — ED Provider Notes (Addendum)
[facility] Emergency Department Provider Note  ____________________________________________  Time seen: Approximately 5:48 PM  I have reviewed the triage vital signs and the nursing notes.   HISTORY  Chief Complaint Abdominal Pain    HPI Benjamin Parks is a 85 y.o. male with a history of HTN, HL, CVA and DM presenting for ongoing symptoms after evaluation and treatment for diverticulitis.  The patient was discharged 11/01/2018 after admission for diverticulitis with microperforation that was treated conservatively with IV antibiotics.  He was discharged home on Augmentin.  He states that he has been taking his medication but over the last 5 days, his pain has not improved.  He does not feel like it is significantly worse.  His pain is described as a dull pain in the left upper and left lower quadrant.  He has not had any fevers, chills, nausea or vomiting, and he is having normal bowel movements including one this morning.  He has taken ibuprofen for pain with improvement.  Past Medical History:  Diagnosis Date  . Diabetes mellitus without complication (HCC)   . History of heart attack   . Hyperlipidemia   . Hypertension   . PVC (premature ventricular contraction)   . Right bundle branch block   . Stroke [facility])     Patient Active Problem List   Diagnosis Date Noted  . Sigmoid diverticulitis 10/30/2018  . SOBOE (shortness of breath on exertion) 06/17/2018  . BPH with obstruction/lower urinary tract symptoms 01/01/2017  . Hematuria 01/01/2017  . Benign essential HTN 11/16/2015  . CAD (coronary artery disease) 11/16/2015  . Mixed hyperlipidemia 11/16/2015  . Non-STEMI (non-ST elevated myocardial infarction) (HCC) 11/07/2015  . Community acquired pneumonia 11/07/2015  . NSTEMI (non-ST elevated myocardial infarction) (HCC) 11/07/2015  . Chronic cystitis 08/17/2013  . Elevated PSA 08/17/2013  . Gross hematuria 08/16/2012  . Incomplete emptying of bladder  08/16/2012  . Nodular prostate with urinary obstruction 08/16/2012  . Retention of urine 08/16/2012  . Urinary tract infection 08/16/2012    Past Surgical History:  Procedure Laterality Date  . APPENDECTOMY    . CARDIAC CATHETERIZATION N/A 11/08/2015   Procedure: Left Heart Cath and Coronary Angiography;  Surgeon: Dalia Heading, MD;  Location: [facility] INVASIVE CV LAB;  Service: Cardiovascular;  Laterality: N/A;  . HERNIA REPAIR    . none    . TONSILLECTOMY      Current Outpatient Rx  . Order #: [id number] Class: Normal  . Order #: [id number] Class: Normal  . Order #: [id number] Class: Normal  . Order #: [id number] Class: Normal  . Order #: [id number] Class: Historical Med  . Order #: [id number] Class: Historical Med  . Order #: [id number] Class: Historical Med  . Order #: [id number] Class: Historical Med  . Order #: [id number] Class: Normal  . Order #: [id number] Class: Normal  . Order #: [id number] Class: Normal  . Order #: [id number] Class: Normal  . Order #: [id number] Class: Historical Med  . Order #: [id number] Class: Normal  . Order #: [id number] Class: Historical Med    Allergies Azithromycin; Erythromycin base; and Metformin and related  Family History  Problem Relation Age of Onset  . Heart attack Mother        deceased  . Heart attack Father        deceased    Social History Social History   Tobacco Use  . Smoking status: Former Games developer  . Smokeless tobacco: Never Used  Substance Use Topics  . Alcohol use: Yes    Alcohol/week: 0.0 standard drinks    Comment: one  cocktail every day.  . Drug use: No    Review of Systems Constitutional: No fever/chills.  No lightheadedness or syncope. Eyes: No visual changes. ENT: No sore throat. No congestion or rhinorrhea. Cardiovascular: Denies chest pain. Denies palpitations. Respiratory: Denies shortness of breath.  No cough. Gastrointestinal: Positive left upper quadrant abdominal pain.  No nausea, no vomiting.  No diarrhea.  No  constipation. Genitourinary: Negative for dysuria. Musculoskeletal: Negative for back pain. Skin: Negative for rash. Neurological: Negative for headaches. No focal numbness, tingling or weakness.     ____________________________________________   PHYSICAL EXAM:  VITAL SIGNS: ED Triage Vitals  Enc Vitals Group     BP 11/04/18 1502 (!) 141/54     Pulse Rate 11/04/18 1502 (!) 54     Resp 11/04/18 1502 16     Temp 11/04/18 1502 97.7 F (36.5 C)     Temp Source 11/04/18 1502 Oral     SpO2 11/04/18 1502 95 %     Weight 11/04/18 1505 176 lb 2.4 oz (79.9 kg)     Height 11/04/18 1505 5\' 10"  (1.778 m)     Head Circumference --      Peak Flow --      Pain Score 11/04/18 1504 5     Pain Loc --      Pain Edu? --      Excl. in GC? --     Constitutional: Alert and oriented. Answers questions appropriately.  Well appearing for his age. Eyes: Conjunctivae are normal.  EOMI. No scleral icterus. Head: Atraumatic. Nose: No congestion/rhinnorhea. Mouth/Throat: Mucous membranes are moist.  Neck: No stridor.  Supple.  No meningismus Cardiovascular: Normal rate, regular rhythm. No murmurs, rubs or gallops.  Respiratory: Normal respiratory effort.  No accessory muscle use or retractions. Lungs CTAB.  No wheezes, rales or ronchi. Gastrointestinal: Soft, and nondistended.  Tender to palpation in the left upper quadrant.  No tenderness to palpation left lower quadrant.  No guarding or rebound.  No peritoneal signs. Musculoskeletal: No LE edema. No ttp in the calves or palpable cords.  Negative Homan's sign. Neurologic:  A&Ox3.  Speech is clear.  Face and smile are symmetric.  EOMI.  Moves all extremities well. Skin:  Skin is warm, dry and intact. No rash noted. Psychiatric: Mood and affect are normal. Speech and behavior are normal.  Normal judgement.  ____________________________________________   LABS (all labs ordered are listed, but only abnormal results are displayed)  Labs Reviewed   COMPREHENSIVE METABOLIC PANEL - Abnormal; Notable for the following components:      Result Value   Sodium 133 (*)    Potassium 5.2 (*)    Glucose, Bld 219 (*)    Calcium 8.6 (*)    All other components within normal limits  CBC - Abnormal; Notable for the following components:   WBC 13.0 (*)    All other components within normal limits   ____________________________________________  EKG  ED ECG REPORT I, Anne-Caroline Sharma Covert, the attending physician, personally viewed and interpreted this ECG.   Date: 11/04/2018  EKG Time: 1948  Rate: 69  Rhythm: normal sinus rhythm; RBBB  Axis: leftward  Intervals:none  ST&T Change: No STEMI No evidence of hyperacute T waves  ____________________________________________  RADIOLOGY  No results found.  ____________________________________________   PROCEDURES  Procedure(s) performed: None  Procedures  Critical Care performed: Yes ____________________________________________   INITIAL IMPRESSION / ASSESSMENT AND PLAN / ED COURSE  Pertinent labs & imaging results that were available during my care  of the patient were reviewed by me and considered in my medical decision making (see chart for details).  85 y.o. male discharged 2/17 after diverticulitis with microperforations, on oral Augmentin, presenting with ongoing pain.  Overall, the patient is hemodynamically stable and afebrile.  He does have tenderness on my examination.  I have ordered a repeat CT for reevaluation.  He is been given ciprofloxacin and Flagyl IV for treatment of presumed continuation of his diverticulitis.  We also get a UA to rule out UTI.  I have offered the patient pain medication and he has deferred at this time.  Plan reevaluation for final disposition.  ----------------------------------------- 6:28 PM on 11/04/2018 -----------------------------------------  Patient CT scan is very concerning as it shows progressive perforation of his sigmoid  diverticulitis, now with new abscess measuring up to 37 mm, as well as fistula formation.  The patient has received his antibiotics and is n.p.o.  He has received intravenous fluids.  His white blood cell count is 13 and a lactic acid is pending.  He continues to be hemodynamically stable and relatively pain-free.  I have called surgery for consultation.  CRITICAL CARE Performed by: Rockne MenghiniAnne-Caroline Brydan Downard   Total critical care time: 45 minutes  Critical care time was exclusive of separately billable procedures and treating other patients.  Critical care was necessary to treat or prevent imminent or life-threatening deterioration.  Critical care was time spent personally by me on the following activities: development of treatment plan with patient and/or surrogate as well as nursing, discussions with consultants, evaluation of patient's response to treatment, examination of patient, obtaining history from patient or surrogate, ordering and performing treatments and interventions, ordering and review of laboratory studies, ordering and review of radiographic studies, pulse oximetry and re-evaluation of patient's condition.    ____________________________________________  FINAL CLINICAL IMPRESSION(S) / ED DIAGNOSES  Final diagnoses:  None         NEW MEDICATIONS STARTED DURING THIS VISIT:  New Prescriptions   No medications on file      Rockne MenghiniNorman, Anne-Caroline, MD 11/04/18 Silva [id number]    Rockne MenghiniNorman, Anne-Caroline, MD 11/04/18 [id number]    Rockne MenghiniNorman, Anne-Caroline, MD 11/04/18 2023

## 2018-11-04 NOTE — ED Triage Notes (Signed)
Pt to ED reporting 4/10 abd pain and elevated WBC per [place] [facility]. Pt was d/c from the hospital three days ago for the same problem with diverticulitis. Pain has not changed but KC reported pt needed to be seen for the elevated WBC count.   Pt reporting he believes he had a micro perforation of his bowel while admitted but is unsure exactly.

## 2018-11-04 NOTE — ED Notes (Signed)
Discussed pt presentation with EDP Sharma Covert who states she will evaluate pt while in subwait. Orders received for IV abt and CT abdomen/pelvis. Pt updated on plan of care.

## 2018-11-04 NOTE — Progress Notes (Signed)
Pharmacy Antibiotic Note  Benjamin Parks is a 85 y.o. male admitted on 11/04/2018 with intra abdominal infection.  Pharmacy has been consulted for Meropenem dosing.  Plan: Will start meropenem 1 gm IV Q12H.   Height: 5\' 10"  (177.8 cm) Weight: 170 lb 6.4 oz (77.3 kg) IBW/kg (Calculated) : 73  Temp (24hrs), Avg:97.6 F (36.4 C), Min:97.6 F (36.4 C), Max:97.7 F (36.5 C)  Recent Labs  Lab 10/30/18 2015 10/30/18 2303 10/31/18 0443 10/31/18 1018 11/01/18 0553 11/04/18 1513 11/04/18 1953  WBC 19.2*  --   --  12.6* 10.6* 13.0*  --   CREATININE 0.98  --   --   --  0.93 0.97  --   LATICACIDVEN  --  2.1* 1.5  --   --   --  1.5    Estimated Creatinine Clearance: 43.9 mL/min (by C-G formula based on SCr of 0.97 mg/dL).    Allergies  Allergen Reactions  . Azithromycin   . Erythromycin Base     Other reaction(s): UNKNOWN  . Metformin And Related Other (See Comments)    unknown    Antimicrobials this admission:   >>    >>   Dose adjustments this admission:   Microbiology results:  BCx:   UCx:    Sputum:    MRSA PCR:   Thank you for allowing pharmacy to be a part of this patient's care.  Rahma Meller D 11/04/2018 9:40 PM

## 2018-11-04 NOTE — ED Notes (Signed)
First nurse note: pt presents c/o abd pain. Discharged from [facility] hospital 11/01/18 for diverticulitis with micro perforation, treated conservatively per discharge MD note.

## 2018-11-05 ENCOUNTER — Inpatient Hospital Stay: Payer: Medicare Other

## 2018-11-05 LAB — GLUCOSE, CAPILLARY
Glucose-Capillary: 167 mg/dL — ABNORMAL HIGH (ref 70–99)
Glucose-Capillary: 99 mg/dL (ref 70–99)
Glucose-Capillary: 77 mg/dL (ref 70–99)
Glucose-Capillary: 95 mg/dL (ref 70–99)

## 2018-11-05 LAB — CBC
HCT: 36.8 % — ABNORMAL LOW (ref 39.0–52.0)
Hemoglobin: 12.1 g/dL — ABNORMAL LOW (ref 13.0–17.0)
MCH: 30.3 pg (ref 26.0–34.0)
MCHC: 32.9 g/dL (ref 30.0–36.0)
MCV: 92.2 fL (ref 80.0–100.0)
PLATELETS: 215 10*3/uL (ref 150–400)
RBC: 3.99 MIL/uL — AB (ref 4.22–5.81)
RDW: 12.9 % (ref 11.5–15.5)
WBC: 11.1 10*3/uL — ABNORMAL HIGH (ref 4.0–10.5)
nRBC: 0 % (ref 0.0–0.2)

## 2018-11-05 LAB — URINALYSIS, ROUTINE W REFLEX MICROSCOPIC
Bilirubin Urine: NEGATIVE
GLUCOSE, UA: NEGATIVE mg/dL
Hgb urine dipstick: NEGATIVE
KETONES UR: 5 mg/dL — AB
Leukocytes,Ua: NEGATIVE
Nitrite: NEGATIVE
Protein, ur: NEGATIVE mg/dL
Specific Gravity, Urine: 1.01 (ref 1.005–1.030)
pH: 6 (ref 5.0–8.0)

## 2018-11-05 LAB — BASIC METABOLIC PANEL
Anion gap: 5 (ref 5–15)
BUN: 17 mg/dL (ref 8–23)
CO2: 26 mmol/L (ref 22–32)
Calcium: 8.2 mg/dL — ABNORMAL LOW (ref 8.9–10.3)
Chloride: 105 mmol/L (ref 98–111)
Creatinine, Ser: 0.8 mg/dL (ref 0.61–1.24)
GFR calc Af Amer: >60 mL/min (ref >60)
GFR calc non Af Amer: >60 mL/min (ref >60)
Glucose, Bld: 192 mg/dL — ABNORMAL HIGH (ref 70–99)
Potassium: 4.6 mmol/L (ref 3.5–5.1)
Sodium: 136 mmol/L (ref 135–145)

## 2018-11-05 LAB — PHOSPHORUS: Phosphorus: 3.4 mg/dL (ref 2.5–4.6)

## 2018-11-05 LAB — MAGNESIUM: Magnesium: 1.9 mg/dL (ref 1.7–2.4)

## 2018-11-05 MED ORDER — HYDROMORPHONE HCL 1 MG/ML IJ SOLN
0.5000 mg | Freq: Once | INTRAMUSCULAR | Status: AC
  Administered 2018-11-05: 0.5 mg via INTRAVENOUS
  Filled 2018-11-05: qty 0.5

## 2018-11-05 MED ORDER — MIDAZOLAM HCL 5 MG/5ML IJ SOLN
INTRAMUSCULAR | Status: AC | PRN
  Administered 2018-11-05: 1 mg via INTRAVENOUS

## 2018-11-05 MED ORDER — MIDAZOLAM HCL 5 MG/5ML IJ SOLN
INTRAMUSCULAR | Status: AC
  Filled 2018-11-05: qty 5

## 2018-11-05 MED ORDER — FENTANYL CITRATE (PF) 100 MCG/2ML IJ SOLN
INTRAMUSCULAR | Status: AC | PRN
  Administered 2018-11-05: 50 ug via INTRAVENOUS

## 2018-11-05 MED ORDER — FENTANYL CITRATE (PF) 100 MCG/2ML IJ SOLN
INTRAMUSCULAR | Status: AC
  Filled 2018-11-05: qty 4

## 2018-11-05 MED ORDER — SODIUM CHLORIDE 0.9% FLUSH
5.0000 mL | Freq: Three times a day (TID) | INTRAVENOUS | Status: DC
  Administered 2018-11-05 – 2018-11-12 (×20): 5 mL

## 2018-11-05 NOTE — Progress Notes (Signed)
Subjective:  CC:  Benjamin Parks is a 85 y.o. male  Hospital stay day 1,   sigmoid diverticulitis  HPI: No acute issues overnight.  ROS:  General: Denies weight loss, weight gain, fatigue, fevers, chills, and night sweats. Heart: Denies chest pain, palpitations, racing heart, irregular heartbeat, leg pain or swelling, and decreased activity tolerance. Respiratory: Denies breathing difficulty, shortness of breath, wheezing, cough, and sputum. GI: Denies change in appetite, heartburn, nausea, vomiting, constipation, diarrhea, and blood in stool. GU: Denies difficulty urinating, pain with urinating, urgency, frequency, blood in urine.   Objective:      Temp:  [97.5 F (36.4 C)-98 F (36.7 C)] 98 F (36.7 C) (02/21 0805) Pulse Rate:  [52-58] 52 (02/21 0905) Resp:  [13-20] 18 (02/21 0805) BP: (129-182)/(54-66) 137/59 (02/21 0905) SpO2:  [94 %-100 %] 98 % (02/21 0905) Weight:  [77.3 kg-79.9 kg] 77.3 kg (02/20 2129)     Height: 5\' 10"  (177.8 cm) Weight: 77.3 kg BMI (Calculated): 24.45   Intake/Output this shift:   Intake/Output Summary (Last 24 hours) at 11/05/2018 1021 Last data filed at 11/05/2018 0400 Gross per 24 hour  Intake 618.76 ml  Output 500 ml  Net 118.76 ml        Constitutional :  alert, cooperative, appears stated age and no distress  Respiratory:  clear to auscultation bilaterally  Cardiovascular:  regular rate and rhythm  Gastrointestinal: soft, persistant periumbilical tenderness, no significant change from last exam.   Skin: Cool and moist.   Psychiatric: Normal affect, non-agitated, not confused       LABS:  CMP Latest Ref Rng & Units 11/05/2018 11/04/2018 11/01/2018  Glucose 70 - 99 mg/dL 734(K) 876(O) 115(B)  BUN 8 - 23 mg/dL 17 21 26(O)  Creatinine 0.61 - 1.24 mg/dL 0.35 5.97 4.16  Sodium 135 - 145 mmol/L 136 133(L) 135  Potassium 3.5 - 5.1 mmol/L 4.6 5.2(H) 4.1  Chloride 98 - 111 mmol/L 105 101 105  CO2 22 - 32 mmol/L 26 23 24   Calcium 8.9 - 10.3  mg/dL 8.2(L) 8.6(L) 8.0(L)  Total Protein 6.5 - 8.1 g/dL - 6.7 -  Total Bilirubin 0.3 - 1.2 mg/dL - 1.1 -  Alkaline Phos 38 - 126 U/L - 76 -  AST 15 - 41 U/L - 18 -  ALT 0 - 44 U/L - 24 -   CBC Latest Ref Rng & Units 11/05/2018 11/04/2018 11/01/2018  WBC 4.0 - 10.5 K/uL 11.1(H) 13.0(H) 10.6(H)  Hemoglobin 13.0 - 17.0 g/dL 12.1(L) 13.5 11.4(L)  Hematocrit 39.0 - 52.0 % 36.8(L) 40.7 34.1(L)  Platelets 150 - 400 K/uL 215 259 169    RADS: n/a Assessment:   Persistent sigmoid diverticulitis.  Discussed case with IR and they will attempt drainage today.  Continue current management in the meantime.  Continue home meds minus plavix in prepration for IR procedure

## 2018-11-05 NOTE — Procedures (Signed)
Interventional Radiology Procedure Note  Procedure: CT Guided Drainage of Sigmoid Diverticular Abscess  Complications: None  Estimated Blood Loss: < 10 mL  Findings: 10 Fr drain placed in LLQ sigmoid diverticular abscess with return of purulent fluid. Fluid sample sent for culture analysis. Drain attached to suction bulb drainage.  Will follow.  Jodi Marble. Fredia Sorrow, M.D Pager:  [phone]

## 2018-11-05 NOTE — H&P (Signed)
Subjective:   CC: Persistent sigmoid diverticulitis  HPI:  Benjamin PennaRichard Parks is a 85 y.o. male who was consulted by Benjamin CovertNorman for issue above.  Symptoms were first noted several days ago. Pain is sharp located in the abdomen, confined to the periumbilical area, without radiation.  Associated with nausea, exacerbated by nothing specific.  Admitted earlier in the week for uncomplicated diverticulitis underwent IV antibiotic treatments transitioning to oral antibiotics prior to discharge.  Patient returns to the ED stating that the pain still persistent and work-up showed worsening diverticulitis on the CT scan   Past Medical History:  has a past medical history of Diabetes mellitus without complication (HCC), History of heart attack, Hyperlipidemia, Hypertension, PVC (premature ventricular contraction), Right bundle branch block, and Stroke (HCC).  Past Surgical History:  Past Surgical History:  Procedure Laterality Date  . APPENDECTOMY    . CARDIAC CATHETERIZATION N/A 11/08/2015   Procedure: Left Heart Cath and Coronary Angiography;  Surgeon: Dalia HeadingKenneth A Fath, MD;  Location: [facility] INVASIVE CV LAB;  Service: Cardiovascular;  Laterality: N/A;  . HERNIA REPAIR    . none    . TONSILLECTOMY      Family History: family history includes Heart attack in his father and mother.  Social History:  reports that he has quit smoking. He has never used smokeless tobacco. He reports current alcohol use. He reports that he does not use drugs.  Current Medications:  Medications Prior to Admission  Medication Sig Dispense Refill  . amoxicillin-clavulanate (AUGMENTIN) 875-125 MG tablet Take 1 tablet by mouth 2 (two) times daily for 7 days. 28 tablet 0  . aspirin EC 81 MG EC tablet Take 1 tablet (81 mg total) by mouth daily. 30 tablet 0  . atorvastatin (LIPITOR) 80 MG tablet Take 0.5 tablets (40 mg total) by mouth daily at 6 PM. 30 tablet 0  . clopidogrel (PLAVIX) 75 MG tablet Take 1 tablet (75 mg total) by mouth  daily. 30 tablet 0  . cyanocobalamin (,VITAMIN B-12,) 1000 MCG/ML injection Inject 1,000 mcg into the muscle every 30 (thirty) days.    . finasteride (PROSCAR) 5 MG tablet Take 5 mg by mouth daily.  1  . glipiZIDE (GLUCOTROL) 5 MG tablet Take 0.5 tablets (2.5 mg total) by mouth daily before breakfast. 30 tablet 0  . lisinopril (PRINIVIL,ZESTRIL) 2.5 MG tablet Take 1 tablet (2.5 mg total) by mouth daily. 30 tablet 0  . metoprolol tartrate (LOPRESSOR) 25 MG tablet Take 1 tablet (25 mg total) by mouth 2 (two) times daily. (Patient taking differently: Take 25 mg by mouth daily. ) 30 tablet 0  . Multiple Vitamins-Minerals (PRESERVISION AREDS) CAPS Take 1 capsule by mouth daily.     Marland Kitchen. Dextran 70-Hypromellose (ARTIFICIAL TEARS) 0.1-0.3 % SOLN Apply to eye.    . diclofenac sodium (VOLTAREN) 1 % GEL Apply 2 g topically 2 (two) times daily as needed for pain.  0  . Lancets (ACCU-CHEK MULTICLIX) lancets Use as instructed 100 each 12  . nitroGLYCERIN (NITROSTAT) 0.4 MG SL tablet Place 1 tablet (0.4 mg total) under the tongue every 5 (five) minutes x 3 doses as needed for chest pain. 30 tablet 0  . triamcinolone cream (KENALOG) 0.1 % APPLY TO AFFECTED AREA TWICE A DAY  0    Allergies:  Allergies as of 11/04/2018 - Review Complete 11/04/2018  Allergen Reaction Noted  . Azithromycin  11/07/2015  . Erythromycin base  12/29/2012  . Metformin and related Other (See Comments) 01/03/2017    ROS:  General:  Denies weight loss, weight gain, fatigue, fevers, chills, and night sweats. Eyes: Denies blurry vision, double vision, eye pain, itchy eyes, and tearing. Ears: Denies hearing loss, earache, and ringing in ears. Nose: Denies sinus pain, congestion, infections, runny nose, and nosebleeds. Mouth/throat: Denies hoarseness, sore throat, bleeding gums, and difficulty swallowing. Heart: Denies chest pain, palpitations, racing heart, irregular heartbeat, leg pain or swelling, and decreased activity  tolerance. Respiratory: Denies breathing difficulty, shortness of breath, wheezing, cough, and sputum. GI: Denies change in appetite, heartburn, vomiting, constipation, diarrhea, and blood in stool. GU: Denies difficulty urinating, pain with urinating, urgency, frequency, blood in urine. Musculoskeletal: Denies joint stiffness, pain, swelling, muscle weakness. Skin: Denies rash, itching, mass, tumors, sores, and boils Neurologic: Denies headache, fainting, dizziness, seizures, numbness, and tingling. Psychiatric: Denies depression, anxiety, difficulty sleeping, and memory loss. Endocrine: Denies heat or cold intolerance, and increased thirst or urination. Blood/lymph: Denies easy bruising, easy bruising, and swollen glands     Objective:     BP (!) 137/59   Pulse (!) 52   Temp 98 F (36.7 C) (Oral)   Resp 18   Ht 5\' 10"  (1.778 m)   Wt 77.3 kg   SpO2 98%   BMI 24.45 kg/m   Constitutional :  alert, cooperative, appears stated age and no distress  Lymphatics/Throat:  no asymmetry, masses, or scars  Respiratory:  clear to auscultation bilaterally  Cardiovascular:  regular rate and rhythm, S1, S2 normal, no murmur, click, rub or gallop  Gastrointestinal: Soft, no guarding, focal tenderness in the periumbilical area.   Musculoskeletal: Steady gait and movement  Skin: Cool and moist  Psychiatric: Normal affect, non-agitated, not confused       LABS:  CMP Latest Ref Rng & Units 11/05/2018 11/04/2018 11/01/2018  Glucose 70 - 99 mg/dL 902(I) 097(D) 532(D)  BUN 8 - 23 mg/dL 17 21 92(E)  Creatinine 0.61 - 1.24 mg/dL 2.68 3.41 9.62  Sodium 135 - 145 mmol/L 136 133(L) 135  Potassium 3.5 - 5.1 mmol/L 4.6 5.2(H) 4.1  Chloride 98 - 111 mmol/L 105 101 105  CO2 22 - 32 mmol/L 26 23 24   Calcium 8.9 - 10.3 mg/dL 8.2(L) 8.6(L) 8.0(L)  Total Protein 6.5 - 8.1 g/dL - 6.7 -  Total Bilirubin 0.3 - 1.2 mg/dL - 1.1 -  Alkaline Phos 38 - 126 U/L - 76 -  AST 15 - 41 U/L - 18 -  ALT 0 - 44 U/L - 24  -   CBC Latest Ref Rng & Units 11/05/2018 11/04/2018 11/01/2018  WBC 4.0 - 10.5 K/uL 11.1(H) 13.0(H) 10.6(H)  Hemoglobin 13.0 - 17.0 g/dL 12.1(L) 13.5 11.4(L)  Hematocrit 39.0 - 52.0 % 36.8(L) 40.7 34.1(L)  Platelets 150 - 400 K/uL 215 259 169    RADS: CLINICAL DATA:  85 y/o M; leukocytosis. Recent hospitalization and discharge for diverticulitis.  EXAM: CT ABDOMEN AND PELVIS WITH CONTRAST  TECHNIQUE: Multidetector CT imaging of the abdomen and pelvis was performed using the standard protocol following bolus administration of intravenous contrast.  CONTRAST:  ISOVUE-300 IOPAMIDOL (ISOVUE-300) INJECTION 61%  COMPARISON:  10/30/2018 CT abdomen and pelvis.  FINDINGS: Lower chest: No acute abnormality.  Hepatobiliary: Stable tiny cysts at the dome of liver. Otherwise no focal liver abnormality is seen. No gallstones, gallbladder wall thickening, or biliary dilatation.  Pancreas: Unremarkable. No pancreatic ductal dilatation or surrounding inflammatory changes.  Spleen: Normal in size without focal abnormality.  Adrenals/Urinary Tract: Adrenal glands are unremarkable. Kidneys are normal, without renal calculi, focal lesion,  or hydronephrosis. Multiple small bladder diverticula, likely sequelae of chronic outflow obstruction.  Stomach/Bowel: Perforated acute sigmoid diverticulitis again noted. There is increased air extending into the mesentery in comparison with the prior CT of abdomen and pelvis. Additionally, there are fistulas (series 2, image 64 and 66) and organizing air and fluid-filled collection posterior to the sigmoid colon measuring 24 x 37 mm (series 2, image 62) likely representing a developing abscess.  Vascular/Lymphatic: Aortic atherosclerosis. No enlarged abdominal or pelvic lymph nodes.  Reproductive: Prostate enlargement.  Other: No abdominal wall hernia or abnormality. No abdominopelvic ascites.  Musculoskeletal: Stable  fracture traversing the anterior aspect of L1 vertebral body without loss of vertebral body height. No new fracture. Stable osteoarthrosis of hip joints and the lumbar spine. Mild lumbar spine levocurvature.  IMPRESSION: 1. Perforated acute sigmoid diverticulitis again noted. Increased extraluminal air. Additionally, there are developing fistulas and a pericolonic abscess measuring up to 37 mm. 2. Stable fracture traversing the anterior aspect of L1 vertebral body without loss of vertebral body height.   Electronically Signed   By: Mitzi Hansen M.D.   On: 11/04/2018 17:58 Assessment:   Sigmoid diverticulitis, now with abscess.  Plan:   Oral antibiotics did not contain the initial episode of diverticulitis.  Will likely recommend IR guided drainage of the abscess in the morning.  Admit for pain control, IV fluids, IV antibiotics, and keep n.p.o.

## 2018-11-05 NOTE — Care Management (Signed)
Patient resides at [facility]. Persistent sigmoid diverticulitis.  Discussed case with IR and they will attempt drainage today.  Per nursing staff patient is independent of all ADL's.  Ambulates with RW.

## 2018-11-05 NOTE — Progress Notes (Signed)
Per MD okay for RN to place order for urinalysis and foley catheter insertion.

## 2018-11-06 LAB — CBC
HCT: 36.3 % — ABNORMAL LOW (ref 39.0–52.0)
Hemoglobin: 12.1 g/dL — ABNORMAL LOW (ref 13.0–17.0)
MCH: 30.2 pg (ref 26.0–34.0)
MCHC: 33.3 g/dL (ref 30.0–36.0)
MCV: 90.5 fL (ref 80.0–100.0)
Platelets: 227 10*3/uL (ref 150–400)
RBC: 4.01 MIL/uL — ABNORMAL LOW (ref 4.22–5.81)
RDW: 12.8 % (ref 11.5–15.5)
WBC: 13.5 10*3/uL — ABNORMAL HIGH (ref 4.0–10.5)
nRBC: 0 % (ref 0.0–0.2)

## 2018-11-06 LAB — BASIC METABOLIC PANEL
Anion gap: 7 (ref 5–15)
BUN: 15 mg/dL (ref 8–23)
CHLORIDE: 104 mmol/L (ref 98–111)
CO2: 24 mmol/L (ref 22–32)
Calcium: 8.2 mg/dL — ABNORMAL LOW (ref 8.9–10.3)
Creatinine, Ser: 0.76 mg/dL (ref 0.61–1.24)
GFR calc Af Amer: >60 mL/min (ref >60)
GFR calc non Af Amer: >60 mL/min (ref >60)
Glucose, Bld: 123 mg/dL — ABNORMAL HIGH (ref 70–99)
Potassium: 4.3 mmol/L (ref 3.5–5.1)
Sodium: 135 mmol/L (ref 135–145)

## 2018-11-06 LAB — GLUCOSE, CAPILLARY
Glucose-Capillary: 109 mg/dL — ABNORMAL HIGH (ref 70–99)
Glucose-Capillary: 77 mg/dL (ref 70–99)
Glucose-Capillary: 100 mg/dL — ABNORMAL HIGH (ref 70–99)
Glucose-Capillary: 63 mg/dL — ABNORMAL LOW (ref 70–99)
Glucose-Capillary: 70 mg/dL (ref 70–99)

## 2018-11-06 LAB — PHOSPHORUS: Phosphorus: 3.4 mg/dL (ref 2.5–4.6)

## 2018-11-06 LAB — MAGNESIUM: Magnesium: 1.8 mg/dL (ref 1.7–2.4)

## 2018-11-06 MED ORDER — SODIUM CHLORIDE 0.9 % IV SOLN
1.0000 g | Freq: Three times a day (TID) | INTRAVENOUS | Status: DC
  Administered 2018-11-06 – 2018-11-11 (×15): 1 g via INTRAVENOUS
  Filled 2018-11-06 (×17): qty 1

## 2018-11-06 NOTE — Progress Notes (Signed)
SURGICAL PROGRESS NOTE   Hospital Day(s): 2.   Post op day(s):  Marland Kitchen   Interval History: Patient seen and examined, no acute events or new complaints overnight. Patient reports continued with significant pain of left lower quadrant., denies fever chills.  Denies nausea or vomiting.  Vital signs in last 24 hours: [min-max] current  Temp:  [97.5 F (36.4 C)-98.5 F (36.9 C)] 98.5 F (36.9 C) (02/22 0537) Pulse Rate:  [52-64] 64 (02/22 0537) Resp:  [12-20] 20 (02/22 0537) BP: (110-184)/(55-84) 124/61 (02/22 0537) SpO2:  [94 %-100 %] 94 % (02/22 0537)     Height: 5\' 10"  (177.8 cm) Weight: 77.3 kg BMI (Calculated): 24.45    Physical Exam:  Constitutional: alert, cooperative and no distress  Respiratory: breathing non-labored at rest  Cardiovascular: regular rate and sinus rhythm  Gastrointestinal: soft, moderate-tender on left lower quadrant, and non-distended  Labs:  CBC Latest Ref Rng & Units 11/06/2018 11/05/2018 11/04/2018  WBC 4.0 - 10.5 K/uL 13.5(H) 11.1(H) 13.0(H)  Hemoglobin 13.0 - 17.0 g/dL 12.1(L) 12.1(L) 13.5  Hematocrit 39.0 - 52.0 % 36.3(L) 36.8(L) 40.7  Platelets 150 - 400 K/uL 227 215 259   CMP Latest Ref Rng & Units 11/06/2018 11/05/2018 11/04/2018  Glucose 70 - 99 mg/dL 655(V) 748(O) 707(E)  BUN 8 - 23 mg/dL 15 17 21   Creatinine 0.61 - 1.24 mg/dL 6.75 4.49 2.01  Sodium 135 - 145 mmol/L 135 136 133(L)  Potassium 3.5 - 5.1 mmol/L 4.3 4.6 5.2(H)  Chloride 98 - 111 mmol/L 104 105 101  CO2 22 - 32 mmol/L 24 26 23   Calcium 8.9 - 10.3 mg/dL 8.2(L) 8.2(L) 8.6(L)  Total Protein 6.5 - 8.1 g/dL - - 6.7  Total Bilirubin 0.3 - 1.2 mg/dL - - 1.1  Alkaline Phos 38 - 126 U/L - - 76  AST 15 - 41 U/L - - 18  ALT 0 - 44 U/L - - 24    Imaging studies: No new pertinent imaging studies   Assessment/Plan:  85 y.o. male with gated diverticulitis with abscess status post percutaneous drainage. Patient continued with significant pain on the left lower quadrant.  We will continue  with IV antibiotic therapy, IV hydration, pain management.  Patient encouraged to get out of bed.  We will let patient take ice chips.  Will not start diet until pain improves.  Gae Gallop, MD

## 2018-11-06 NOTE — Progress Notes (Signed)
Pharmacy Antibiotic Note  Benjamin Parks is a 85 y.o. male admitted on 11/04/2018 with intra abdominal infection.  Pharmacy has been consulted for Meropenem dosing.  Plan: CrCl improved >68ml/min. Will adjust dose to meropenem 1g IV every 8 hours.   Height: 5\' 10"  (177.8 cm) Weight: 170 lb 6.4 oz (77.3 kg) IBW/kg (Calculated) : 73  Temp (24hrs), Avg:98 F (36.7 C), Min:97.5 F (36.4 C), Max:98.5 F (36.9 C)  Recent Labs  Lab 10/30/18 2015 10/30/18 2303 10/31/18 0443 10/31/18 1018 11/01/18 0553 11/04/18 1513 11/04/18 1953 11/04/18 2309 11/05/18 0457 11/06/18 0528  WBC 19.2*  --   --  12.6* 10.6* 13.0*  --   --  11.1* 13.5*  CREATININE 0.98  --   --   --  0.93 0.97  --   --  0.80 0.76  LATICACIDVEN  --  2.1* 1.5  --   --   --  1.5 1.2  --   --     Estimated Creatinine Clearance: 53.2 mL/min (by C-G formula based on SCr of 0.76 mg/dL).    Allergies  Allergen Reactions  . Azithromycin   . Erythromycin Base     Other reaction(s): UNKNOWN  . Metformin And Related Other (See Comments)    unknown    Antimicrobials this admission:  Meropenem 2/20 >>   Microbiology results: Wound Cx: Abundant GPC & GNR, moderate GPR   Thank you for allowing pharmacy to be a part of this patient's care.  Gardner Candle, PharmD, BCPS Clinical Pharmacist 11/06/2018 12:27 PM

## 2018-11-07 LAB — BASIC METABOLIC PANEL
Anion gap: 6 (ref 5–15)
BUN: 16 mg/dL (ref 8–23)
CO2: 26 mmol/L (ref 22–32)
Calcium: 8.2 mg/dL — ABNORMAL LOW (ref 8.9–10.3)
Chloride: 103 mmol/L (ref 98–111)
Creatinine, Ser: 0.81 mg/dL (ref 0.61–1.24)
GFR calc non Af Amer: >60 mL/min (ref >60)
Glucose, Bld: 94 mg/dL (ref 70–99)
Potassium: 4.8 mmol/L (ref 3.5–5.1)
Sodium: 135 mmol/L (ref 135–145)

## 2018-11-07 LAB — PHOSPHORUS: Phosphorus: 2.9 mg/dL (ref 2.5–4.6)

## 2018-11-07 LAB — CBC
HCT: 36.3 % — ABNORMAL LOW (ref 39.0–52.0)
Hemoglobin: 12.1 g/dL — ABNORMAL LOW (ref 13.0–17.0)
MCH: 30.6 pg (ref 26.0–34.0)
MCHC: 33.3 g/dL (ref 30.0–36.0)
MCV: 91.9 fL (ref 80.0–100.0)
Platelets: 214 10*3/uL (ref 150–400)
RBC: 3.95 MIL/uL — ABNORMAL LOW (ref 4.22–5.81)
RDW: 12.7 % (ref 11.5–15.5)
WBC: 11.6 10*3/uL — ABNORMAL HIGH (ref 4.0–10.5)
nRBC: 0 % (ref 0.0–0.2)

## 2018-11-07 LAB — GLUCOSE, CAPILLARY
Glucose-Capillary: 88 mg/dL (ref 70–99)
Glucose-Capillary: 207 mg/dL — ABNORMAL HIGH (ref 70–99)
Glucose-Capillary: 113 mg/dL — ABNORMAL HIGH (ref 70–99)
Glucose-Capillary: 174 mg/dL — ABNORMAL HIGH (ref 70–99)

## 2018-11-07 LAB — MAGNESIUM: Magnesium: 1.9 mg/dL (ref 1.7–2.4)

## 2018-11-07 NOTE — Progress Notes (Signed)
SURGICAL PROGRESS NOTE   Hospital Day(s): 3.   Post op day(s):  Benjamin Parks   Interval History: Patient seen and examined, no acute events or new complaints overnight. Patient reports significant improvement abdominal pain, denies nausea or vomiting.  Vital signs in last 24 hours: [min-max] current  Temp:  [97.7 F (36.5 C)-98.5 F (36.9 C)] 97.7 F (36.5 C) (02/23 0541) Pulse Rate:  [52-62] 59 (02/23 0541) Resp:  [15] 15 (02/23 0541) BP: (121-151)/(51-67) 151/60 (02/23 0541) SpO2:  [93 %-97 %] 93 % (02/23 0541)     Height: 5\' 10"  (177.8 cm) Weight: 77.3 kg BMI (Calculated): 24.45   Drain: Not charted.  There is some nature of purulent, serous and bloody secretions.  Physical Exam:  Constitutional: alert, cooperative and no distress  Respiratory: breathing non-labored at rest  Cardiovascular: regular rate and sinus rhythm  Gastrointestinal: soft, mild-tender, and non-distended.  Drain in place  Labs:  CBC Latest Ref Rng & Units 11/07/2018 11/06/2018 11/05/2018  WBC 4.0 - 10.5 K/uL 11.6(H) 13.5(H) 11.1(H)  Hemoglobin 13.0 - 17.0 g/dL 12.1(L) 12.1(L) 12.1(L)  Hematocrit 39.0 - 52.0 % 36.3(L) 36.3(L) 36.8(L)  Platelets 150 - 400 K/uL 214 227 215   CMP Latest Ref Rng & Units 11/07/2018 11/06/2018 11/05/2018  Glucose 70 - 99 mg/dL 94 416(L) 845(X)  BUN 8 - 23 mg/dL 16 15 17   Creatinine 0.61 - 1.24 mg/dL 6.46 8.03 2.12  Sodium 135 - 145 mmol/L 135 135 136  Potassium 3.5 - 5.1 mmol/L 4.8 4.3 4.6  Chloride 98 - 111 mmol/L 103 104 105  CO2 22 - 32 mmol/L 26 24 26   Calcium 8.9 - 10.3 mg/dL 8.2(L) 8.2(L) 8.2(L)  Total Protein 6.5 - 8.1 g/dL - - -  Total Bilirubin 0.3 - 1.2 mg/dL - - -  Alkaline Phos 38 - 126 U/L - - -  AST 15 - 41 U/L - - -  ALT 0 - 44 U/L - - -    Imaging studies: No new pertinent imaging studies   Assessment/Plan:  85 y.o. male with gated diverticulitis with abscess status post percutaneous drainage. Today with significant improvement of the abdominal pain.  We will  continue with IV antibiotic therapy and will also start clear liquid diet.  Patient encouraged to ambulate and get out of bed to chair.  We will continue follow-up of the drainage.  There is no fever and no tachycardia.  Gae Gallop, MD

## 2018-11-08 LAB — CBC
HCT: 37.7 % — ABNORMAL LOW (ref 39.0–52.0)
Hemoglobin: 12.4 g/dL — ABNORMAL LOW (ref 13.0–17.0)
MCH: 29.9 pg (ref 26.0–34.0)
MCHC: 32.9 g/dL (ref 30.0–36.0)
MCV: 90.8 fL (ref 80.0–100.0)
Platelets: 228 10*3/uL (ref 150–400)
RBC: 4.15 MIL/uL — ABNORMAL LOW (ref 4.22–5.81)
RDW: 12.8 % (ref 11.5–15.5)
WBC: 10.7 10*3/uL — ABNORMAL HIGH (ref 4.0–10.5)
nRBC: 0 % (ref 0.0–0.2)

## 2018-11-08 LAB — GLUCOSE, CAPILLARY
GLUCOSE-CAPILLARY: 140 mg/dL — AB (ref 70–99)
Glucose-Capillary: 249 mg/dL — ABNORMAL HIGH (ref 70–99)
Glucose-Capillary: 195 mg/dL — ABNORMAL HIGH (ref 70–99)
Glucose-Capillary: 121 mg/dL — ABNORMAL HIGH (ref 70–99)

## 2018-11-08 LAB — AEROBIC/ANAEROBIC CULTURE (SURGICAL/DEEP WOUND)

## 2018-11-08 LAB — AEROBIC/ANAEROBIC CULTURE W GRAM STAIN (SURGICAL/DEEP WOUND): Special Requests: NORMAL

## 2018-11-08 LAB — BASIC METABOLIC PANEL
ANION GAP: 7 (ref 5–15)
BUN: 15 mg/dL (ref 8–23)
CO2: 26 mmol/L (ref 22–32)
Calcium: 8.2 mg/dL — ABNORMAL LOW (ref 8.9–10.3)
Chloride: 101 mmol/L (ref 98–111)
Creatinine, Ser: 0.88 mg/dL (ref 0.61–1.24)
GFR calc Af Amer: >60 mL/min (ref >60)
GFR calc non Af Amer: >60 mL/min (ref >60)
Glucose, Bld: 148 mg/dL — ABNORMAL HIGH (ref 70–99)
Potassium: 4.6 mmol/L (ref 3.5–5.1)
Sodium: 134 mmol/L — ABNORMAL LOW (ref 135–145)

## 2018-11-08 LAB — PHOSPHORUS: Phosphorus: 2.7 mg/dL (ref 2.5–4.6)

## 2018-11-08 LAB — MAGNESIUM: Magnesium: 1.8 mg/dL (ref 1.7–2.4)

## 2018-11-08 MED ORDER — ASPIRIN EC 81 MG PO TBEC
81.0000 mg | DELAYED_RELEASE_TABLET | Freq: Every day | ORAL | Status: DC
  Administered 2018-11-08 – 2018-11-09 (×2): 81 mg via ORAL
  Filled 2018-11-08 (×2): qty 1

## 2018-11-08 MED ORDER — SODIUM CHLORIDE 0.9 % IV SOLN
INTRAVENOUS | Status: DC | PRN
  Administered 2018-11-08 – 2018-11-10 (×4): 250 mL via INTRAVENOUS

## 2018-11-08 MED ORDER — ASPIRIN 81 MG PO TBEC: 81.0000 mg | DELAYED_RELEASE_TABLET | Freq: Every day | ORAL | Status: DC

## 2018-11-08 MED ORDER — ENSURE ENLIVE PO LIQD
237.0000 mL | Freq: Two times a day (BID) | ORAL | Status: DC
  Administered 2018-11-08 – 2018-11-12 (×8): 237 mL via ORAL

## 2018-11-08 MED ORDER — CLOPIDOGREL BISULFATE 75 MG PO TABS
75.0000 mg | ORAL_TABLET | Freq: Every day | ORAL | Status: DC
  Administered 2018-11-08 – 2018-11-12 (×5): 75 mg via ORAL
  Filled 2018-11-08 (×5): qty 1

## 2018-11-08 NOTE — Care Management Important Message (Signed)
Copy of signed Medicare IM left with patient in room. 

## 2018-11-08 NOTE — Clinical Social Work Note (Signed)
Clinical Social Work Assessment  Patient Details  Name: Benjamin Parks MRN: [id number] Date of Birth: 02-14-1919  Date of referral:  11/08/18               Reason for consult:  Facility Placement                Permission sought to share information with:  Case Manager, Customer service manager, Family Supports Permission granted to share information::  Yes, Verbal Permission Granted  Name::        Agency::     Relationship::     Contact Information:     Housing/Transportation Living arrangements for the past 2 months:  Charity fundraiser of Information:  Patient Patient Interpreter Needed:  None Criminal Activity/Legal Involvement Pertinent to Current Situation/Hospitalization:  No - Comment as needed Significant Relationships:  Adult Children Lives with:  Self Do you feel safe going back to the place where you live?  Yes Need for family participation in patient care:  Yes (Comment)  Care giving concerns:  Patient lives at [facility]    Social Worker assessment / plan:  CSW contacted by [employer] on behalf of patient and family. Per Seth Bake at [facility] patient's family has requested that he come to SNF at discharge. CSW met with patient and son Benjamin Parks at bedside. Per patient he has lived at [facility] for 14 years. Patient states that he does not feel comfortable returning home at discharge and would like to go to SNF. Son is also in agreement. CSW explained that PT evaluation is needed and patient reports that even if insurance does not cover that they would like to go to SNF due to patient requiring a drain. PT is currently pending.  CSW will follow for discharge planning.   Employment status:  Retired Forensic scientist:  Medicare PT Recommendations:  Not assessed at this time Information / Referral to community resources:  [place]  Patient/Family's Response to care:  Patient and son thanked CSW for  assistance   Patient/Family's Understanding of and Emotional Response to Diagnosis, Current Treatment, and Prognosis:  Patient states understanding of current treatment and diagnoses.   Emotional Assessment Appearance:  Appears stated age Attitude/Demeanor/Rapport:    Affect (typically observed):  Accepting, Pleasant Orientation:  Oriented to Self, Oriented to Place, Oriented to  Time, Oriented to Situation Alcohol / Substance use:  Not Applicable Psych involvement (Current and /or in the community):  No (Comment)  Discharge Needs  Concerns to be addressed:  Discharge Planning Concerns Readmission within the last 30 days:  Yes Current discharge risk:  None Barriers to Discharge:  Continued Medical Work up   [employer], [place] 11/08/2018, 4:31 PM

## 2018-11-08 NOTE — Progress Notes (Signed)
PT Cancellation Note  Patient Details Name: Benjamin Parks MRN: [id number] DOB: [birth year]   Cancelled Treatment:    Reason Eval/Treat Not Completed: Other (comment).  Pt feels he is fine, does not need any therapy.  Will recheck tomorrow to be sure he is still doing well.  Nursing was informed to be aware of his decision.    [name] 11/08/2018, 4:50 PM   Samul Dada, PT MS Acute Rehab Dept. Number: [facility] [id number] and [facility] [phone]

## 2018-11-08 NOTE — Progress Notes (Signed)
Subjective:  CC:  Benjamin Parks is a 85 y.o. male  Hospital stay day 4,   sigmoid diverticulitis  HPI: No acute issues overnight.  ROS:  General: Denies weight loss, weight gain, fatigue, fevers, chills, and night sweats. Heart: Denies chest pain, palpitations, racing heart, irregular heartbeat, leg pain or swelling, and decreased activity tolerance. Respiratory: Denies breathing difficulty, shortness of breath, wheezing, cough, and sputum. GI: Denies change in appetite, heartburn, nausea, vomiting, constipation, diarrhea, and blood in stool. GU: Denies difficulty urinating, pain with urinating, urgency, frequency, blood in urine.   Objective:      Temp:  [96.3 F (35.7 C)-97.6 F (36.4 C)] 96.3 F (35.7 C) (02/24 1235) Pulse Rate:  [59-60] 59 (02/24 1235) Resp:  [16-20] 20 (02/24 1235) BP: (122-155)/(57-61) 122/60 (02/24 1235) SpO2:  [96 %-98 %] 98 % (02/24 1235)     Height: 5\' 10"  (177.8 cm) Weight: 77.3 kg BMI (Calculated): 24.45   Intake/Output this shift:   Intake/Output Summary (Last 24 hours) at 11/08/2018 1514 Last data filed at 11/08/2018 1300 Gross per 24 hour  Intake 4303.34 ml  Output 3520 ml  Net 783.34 ml    66ml of feculant drainage in JP bulb    Constitutional :  alert, cooperative, appears stated age and no distress  Respiratory:  clear to auscultation bilaterally  Cardiovascular:  regular rate and rhythm  Gastrointestinal: soft, persistant supraumbilical tenderness, no significant change from last exam.   Skin: Cool and moist.   Psychiatric: Normal affect, non-agitated, not confused       LABS:  CMP Latest Ref Rng & Units 11/08/2018 11/07/2018 11/06/2018  Glucose 70 - 99 mg/dL 403(K) 94 742(V)  BUN 8 - 23 mg/dL 15 16 15   Creatinine 0.61 - 1.24 mg/dL 9.56 3.87 5.64  Sodium 135 - 145 mmol/L 134(L) 135 135  Potassium 3.5 - 5.1 mmol/L 4.6 4.8 4.3  Chloride 98 - 111 mmol/L 101 103 104  CO2 22 - 32 mmol/L 26 26 24   Calcium 8.9 - 10.3 mg/dL 8.2(L)  8.2(L) 8.2(L)  Total Protein 6.5 - 8.1 g/dL - - -  Total Bilirubin 0.3 - 1.2 mg/dL - - -  Alkaline Phos 38 - 126 U/L - - -  AST 15 - 41 U/L - - -  ALT 0 - 44 U/L - - -   CBC Latest Ref Rng & Units 11/08/2018 11/07/2018 11/06/2018  WBC 4.0 - 10.5 K/uL 10.7(H) 11.6(H) 13.5(H)  Hemoglobin 13.0 - 17.0 g/dL 12.4(L) 12.1(L) 12.1(L)  Hematocrit 39.0 - 52.0 % 37.7(L) 36.3(L) 36.3(L)  Platelets 150 - 400 K/uL 228 214 227    RADS: n/a Assessment:   Persistent sigmoid diverticulitis. Pt on clears now and states he is fine as long as no one touches area.  Still with significant tenderness on exam along with feculant drainage, so will continue clears for one more day plus ensure and observe for now.  Foley will be removed now

## 2018-11-08 NOTE — NC FL2 (Signed)
[place] MEDICAID FL2 LEVEL OF CARE SCREENING TOOL     IDENTIFICATION  Patient Name: Benjamin Parks Birthdate: 05/12/1919 Sex: male Admission Date (Current Location): 11/04/2018  [place] and [place] Number:  Chiropodist and Address:  [facility], [address] [place], [place] [address]      Provider Number: [id number]  Attending Physician Name and Address:  Sung Amabile, DO  Relative Name and Phone Number:       Current Level of Care: Hospital Recommended Level of Care: Skilled Nursing Facility Prior Approval Number:    Date Approved/Denied:   PASRR Number: [id number]  Discharge Plan: SNF    Current Diagnoses: Patient Active Problem List   Diagnosis Date Noted  . Diverticulitis large intestine 11/04/2018  . Sigmoid diverticulitis 10/30/2018  . SOBOE (shortness of breath on exertion) 06/17/2018  . BPH with obstruction/lower urinary tract symptoms 01/01/2017  . Hematuria 01/01/2017  . Benign essential HTN 11/16/2015  . CAD (coronary artery disease) 11/16/2015  . Mixed hyperlipidemia 11/16/2015  . Non-STEMI (non-ST elevated myocardial infarction) (HCC) 11/07/2015  . Community acquired pneumonia 11/07/2015  . NSTEMI (non-ST elevated myocardial infarction) (HCC) 11/07/2015  . Chronic cystitis 08/17/2013  . Elevated PSA 08/17/2013  . Gross hematuria 08/16/2012  . Incomplete emptying of bladder 08/16/2012  . Nodular prostate with urinary obstruction 08/16/2012  . Retention of urine 08/16/2012  . Urinary tract infection 08/16/2012    Orientation RESPIRATION BLADDER Height & Weight     Self, Time, Place, Situation  Normal External catheter Weight: 170 lb 6.4 oz (77.3 kg) Height:  5\' 10"  (177.8 cm)  BEHAVIORAL SYMPTOMS/MOOD NEUROLOGICAL BOWEL NUTRITION STATUS  (none) (none) Continent Diet(Clear liquid to be advanced )  AMBULATORY STATUS COMMUNICATION OF NEEDS Skin   Extensive Assist Verbally Normal                        Personal Care Assistance Level of Assistance  Bathing, Feeding, Dressing Bathing Assistance: Limited assistance Feeding assistance: Independent Dressing Assistance: Limited assistance     Functional Limitations Info  Sight, Hearing, Speech Sight Info: Adequate Hearing Info: Adequate Speech Info: Adequate    SPECIAL CARE FACTORS FREQUENCY  PT (By licensed PT), OT (By licensed OT)     PT Frequency: 5 OT Frequency: 5            Contractures Contractures Info: Not present    Additional Factors Info  Code Status, Allergies Code Status Info: DNR Allergies Info: Azithromycin, Erythromycin Base, Metformin And Related           Current Medications (11/08/2018):  This is the current hospital active medication list Current Facility-Administered Medications  Medication Dose Route Frequency Provider Last Rate Last Dose  . acetaminophen (TYLENOL) tablet 650 mg  650 mg Oral Q6H Sakai, Isami, DO   650 mg at 11/08/18 0515  . aspirin EC tablet 81 mg  81 mg Oral Daily Sakai, Isami, DO      . atorvastatin (LIPITOR) tablet 40 mg  40 mg Oral q1800 Sakai, Isami, DO   40 mg at 11/06/18 1848  . clopidogrel (PLAVIX) tablet 75 mg  75 mg Oral Daily Sakai, Isami, DO      . dextrose 50 % solution 12.5 g  12.5 g Intravenous PRN Sakai, Isami, DO   12.5 g at 11/06/18 1640  . diclofenac sodium (VOLTAREN) 1 % transdermal gel 2 g  2 g Topical BID PRN Sakai, Isami, DO      .  docusate sodium (COLACE) capsule 100 mg  100 mg Oral BID PRN Sakai, Isami, DO      . feeding supplement (ENSURE ENLIVE) (ENSURE ENLIVE) liquid 237 mL  237 mL Oral BID BM Sakai, Isami, DO   237 mL at 11/08/18 1025  . finasteride (PROSCAR) tablet 5 mg  5 mg Oral Daily Sakai, Isami, DO   5 mg at 11/08/18 0840  . insulin aspart (novoLOG) injection 0-15 Units  0-15 Units Subcutaneous TID WC Sakai, Isami, DO   5 Units at 11/08/18 1321  . lisinopril (PRINIVIL,ZESTRIL) tablet 2.5 mg  2.5 mg Oral Daily Sakai, Isami, DO   2.5 mg at 11/08/18  0841  . meropenem (MERREM) 1 g in sodium chloride 0.9 % 100 mL IVPB  1 g Intravenous Q8H Hallaji, Sheema M, RPH 200 mL/hr at 11/08/18 1323 1 g at 11/08/18 1323  . metoprolol tartrate (LOPRESSOR) tablet 25 mg  25 mg Oral Daily Sakai, Isami, DO   25 mg at 11/08/18 0840  . morphine 2 MG/ML injection 2 mg  2 mg Intravenous Q4H PRN Tonna Boehringer, Isami, DO   2 mg at 11/05/18 2139  . multivitamin-lutein (OCUVITE-LUTEIN) capsule 1 capsule  1 capsule Oral Daily Sakai, Isami, DO   1 capsule at 11/08/18 0841  . ondansetron (ZOFRAN-ODT) disintegrating tablet 4 mg  4 mg Oral Q6H PRN Tonna Boehringer, Isami, DO       Or  . ondansetron (ZOFRAN) injection 4 mg  4 mg Intravenous Q6H PRN Sakai, Isami, DO      . pantoprazole (PROTONIX) injection 40 mg  40 mg Intravenous QHS Sakai, Isami, DO   40 mg at 11/07/18 2121  . sodium chloride 0.9 % bolus 500 mL  500 mL Intravenous Once Sakai, Isami, DO      . sodium chloride flush (NS) 0.9 % injection 5 mL  5 mL Intracatheter Q8H Irish Lack, MD   5 mL at 11/08/18 0515  . traMADol (ULTRAM) tablet 50 mg  50 mg Oral Q6H PRN Sakai, Isami, DO   50 mg at 11/08/18 0840  . triamcinolone cream (KENALOG) 0.1 %   Topical BID PRN Sung Amabile, DO         Discharge Medications: Please see discharge summary for a list of discharge medications.  Relevant Imaging Results:  Relevant Lab Results:   Additional Information SSN: [id number]  Ruthe Mannan, [place]

## 2018-11-09 LAB — BASIC METABOLIC PANEL
Anion gap: 4 — ABNORMAL LOW (ref 5–15)
BUN: 17 mg/dL (ref 8–23)
CO2: 27 mmol/L (ref 22–32)
Calcium: 8.1 mg/dL — ABNORMAL LOW (ref 8.9–10.3)
Chloride: 103 mmol/L (ref 98–111)
Creatinine, Ser: 0.8 mg/dL (ref 0.61–1.24)
GFR calc Af Amer: >60 mL/min (ref >60)
GFR calc non Af Amer: >60 mL/min (ref >60)
GLUCOSE: 167 mg/dL — AB (ref 70–99)
Potassium: 4.6 mmol/L (ref 3.5–5.1)
Sodium: 134 mmol/L — ABNORMAL LOW (ref 135–145)

## 2018-11-09 LAB — CBC
HCT: 35.7 % — ABNORMAL LOW (ref 39.0–52.0)
Hemoglobin: 11.9 g/dL — ABNORMAL LOW (ref 13.0–17.0)
MCH: 29.9 pg (ref 26.0–34.0)
MCHC: 33.3 g/dL (ref 30.0–36.0)
MCV: 89.7 fL (ref 80.0–100.0)
Platelets: 217 10*3/uL (ref 150–400)
RBC: 3.98 MIL/uL — ABNORMAL LOW (ref 4.22–5.81)
RDW: 12.7 % (ref 11.5–15.5)
WBC: 12.6 10*3/uL — ABNORMAL HIGH (ref 4.0–10.5)
nRBC: 0 % (ref 0.0–0.2)

## 2018-11-09 LAB — GLUCOSE, CAPILLARY
GLUCOSE-CAPILLARY: 181 mg/dL — AB (ref 70–99)
Glucose-Capillary: 144 mg/dL — ABNORMAL HIGH (ref 70–99)
Glucose-Capillary: 219 mg/dL — ABNORMAL HIGH (ref 70–99)
Glucose-Capillary: 121 mg/dL — ABNORMAL HIGH (ref 70–99)

## 2018-11-09 LAB — MAGNESIUM: Magnesium: 1.7 mg/dL (ref 1.7–2.4)

## 2018-11-09 LAB — PHOSPHORUS: Phosphorus: 2.9 mg/dL (ref 2.5–4.6)

## 2018-11-09 MED ORDER — HYDROCODONE-ACETAMINOPHEN 5-325 MG PO TABS
1.0000 | ORAL_TABLET | Freq: Four times a day (QID) | ORAL | Status: DC | PRN
  Administered 2018-11-09 – 2018-11-11 (×3): 1 via ORAL
  Filled 2018-11-09 (×3): qty 1

## 2018-11-09 MED ORDER — SIMETHICONE 80 MG PO CHEW
80.0000 mg | CHEWABLE_TABLET | Freq: Four times a day (QID) | ORAL | Status: DC | PRN
  Administered 2018-11-09: 80 mg via ORAL
  Filled 2018-11-09 (×2): qty 1

## 2018-11-09 NOTE — Plan of Care (Signed)
  Problem: Clinical Measurements: Goal: Ability to maintain clinical measurements within normal limits will improve Outcome: Progressing Goal: Will remain free from infection Outcome: Progressing Goal: Diagnostic test results will improve Outcome: Progressing Goal: Respiratory complications will improve Outcome: Progressing Goal: Cardiovascular complication will be avoided Outcome: Progressing   Problem: Activity: Goal: Risk for activity intolerance will decrease Outcome: Progressing Patient ambulating with rolling walker   Problem: Pain Managment: Goal: General experience of comfort will improve Outcome: Progressing

## 2018-11-09 NOTE — Progress Notes (Signed)
Subjective:  CC:  Benjamin Parks is a 85 y.o. male  Hospital stay day 5,   sigmoid diverticulitis  HPI: Complaining of increase bloating this am.  Passing flatus since last night, but still feels bloated.  Pain still present as well.  ROS:  General: Denies weight loss, weight gain, fatigue, fevers, chills, and night sweats. Heart: Denies chest pain, palpitations, racing heart, irregular heartbeat, leg pain or swelling, and decreased activity tolerance. Respiratory: Denies breathing difficulty, shortness of breath, wheezing, cough, and sputum. GI: Denies change in appetite, heartburn, nausea, vomiting, constipation, diarrhea, and blood in stool. GU: Denies difficulty urinating, pain with urinating, urgency, frequency, blood in urine.   Objective:      Temp:  [96.3 F (35.7 C)-98.6 F (37 C)] 97.7 F (36.5 C) (02/25 0543) Pulse Rate:  [58-59] 58 (02/25 0543) Resp:  [18-20] 18 (02/25 0543) BP: (122-153)/(58-68) 153/68 (02/25 0543) SpO2:  [94 %-98 %] 94 % (02/25 0543) Weight:  [77 kg] 77 kg (02/25 0543)     Height: 5\' 10"  (177.8 cm) Weight: 77 kg BMI (Calculated): 24.36   Intake/Output this shift:   Intake/Output Summary (Last 24 hours) at 11/09/2018 1009 Last data filed at 11/09/2018 0958 Gross per 24 hour  Intake 1829.43 ml  Output 860 ml  Net 969.43 ml    79ml of feculant drainage in JP bulb    Constitutional :  alert, cooperative, appears stated age and no distress  Respiratory:  clear to auscultation bilaterally  Cardiovascular:  regular rate and rhythm  Gastrointestinal: soft, persistant supraumbilical tenderness, no significant change from last exam.   Skin: Cool and moist.   Psychiatric: Normal affect, non-agitated, not confused       LABS:  CMP Latest Ref Rng & Units 11/09/2018 11/08/2018 11/07/2018  Glucose 70 - 99 mg/dL 829(H) 371(I) 94  BUN 8 - 23 mg/dL 17 15 16   Creatinine 0.61 - 1.24 mg/dL 9.67 8.93 8.10  Sodium 135 - 145 mmol/L 134(L) 134(L) 135   Potassium 3.5 - 5.1 mmol/L 4.6 4.6 4.8  Chloride 98 - 111 mmol/L 103 101 103  CO2 22 - 32 mmol/L 27 26 26   Calcium 8.9 - 10.3 mg/dL 8.1(L) 8.2(L) 8.2(L)  Total Protein 6.5 - 8.1 g/dL - - -  Total Bilirubin 0.3 - 1.2 mg/dL - - -  Alkaline Phos 38 - 126 U/L - - -  AST 15 - 41 U/L - - -  ALT 0 - 44 U/L - - -   CBC Latest Ref Rng & Units 11/09/2018 11/08/2018 11/07/2018  WBC 4.0 - 10.5 K/uL 12.6(H) 10.7(H) 11.6(H)  Hemoglobin 13.0 - 17.0 g/dL 11.9(L) 12.4(L) 12.1(L)  Hematocrit 39.0 - 52.0 % 35.7(L) 37.7(L) 36.3(L)  Platelets 150 - 400 K/uL 217 228 214    RADS: n/a Assessment:   Persistent sigmoid diverticulitis. Pt on clears now and states he is fine as long as no one touches area.  Still with significant tenderness on exam along with feculant drainage, so will continue clears for one more day plus ensure and observe for now.  Voiding without issue, having small BMs, passing flatus, but pain still unchanged, feculent drainage still persistent, and now with increasing WBC again.  Serial abdominal exams and continue to monitor for now.  Discussed with son about possibility of surgery again since patient not improving as expected.  He states patient is complaining of more pain than he has been reporting to me.  Adjusting pain meds and RN notified of plan.  Will also  try simethicone to see if any relief from bloating.

## 2018-11-09 NOTE — Evaluation (Signed)
Physical Therapy Evaluation Patient Details Name: Benjamin Parks MRN: [id number] DOB: 1918-09-23 Today's Date: 11/09/2018   History of Present Illness  presented to ER secondary to abdominal pain, nausea; admitted for management of sigmoid diverticulitis, s/p CT guided drainage of diverticular abscess with JP drain placement (2/21)  Clinical Impression  Upon evaluation, patient alert and oriented; seated in recliner upon arrival to room.  Eager for mobility efforts when offered.  Rates pain in abdomen 4-5/10; minimally changed with exertion.  Able to complete bed mobility with mod indep; sit/stand, basic transfers and gait (200') with RW, cga for safety.  Demonstrates reciprocal stepping pattern with fair step height/length, fair stability; decreased cadence (10' walk time, 10-11 seconds), but no overt buckling or LOB.  Do recommend continued use of RW for optimal safety at this time. Would benefit from skilled PT to address above deficits and promote optimal return to PLOF; Recommend transition to HHPT upon discharge from acute hospitalization.     Follow Up Recommendations Home health PT    Equipment Recommendations       Recommendations for Other Services       Precautions / Restrictions Precautions Precautions: Fall Precaution Comments: LLQ JP drain Restrictions Weight Bearing Restrictions: No      Mobility  Bed Mobility Overal bed mobility: Needs Assistance Bed Mobility: Sit to Supine       Sit to supine: Modified independent (Device/Increase time)   General bed mobility comments: seated in recliner beginning of session; returned to bed end of session  Transfers Overall transfer level: Needs assistance Equipment used: Rolling walker (2 wheeled) Transfers: Sit to/from Stand Sit to Stand: Min guard            Ambulation/Gait   Gait Distance (Feet): 200 Feet Assistive device: Rolling walker (2 wheeled)   Gait velocity: 10' walk time, 10-11 seconds   General  Gait Details: reciprocal stepping pattern with fair step height/lenght, fair cadence; no buckling, LOB or safety concerns noted.  Stairs            Wheelchair Mobility    Modified Rankin (Stroke Patients Only)       Balance Overall balance assessment: Needs assistance Sitting-balance support: No upper extremity supported;Feet supported Sitting balance-Leahy Scale: Good     Standing balance support: Bilateral upper extremity supported Standing balance-Leahy Scale: Fair                               Pertinent Vitals/Pain Pain Assessment: Faces Faces Pain Scale: Hurts little more Pain Descriptors / Indicators: Grimacing;Guarding Pain Intervention(s): Limited activity within patient's tolerance;Monitored during session;Repositioned    Home Living Family/patient expects to be discharged to:: Assisted living               Home Equipment: [place] Mor - single point Additional Comments: Resident of [facility]; single level with no steps required    Prior Function Level of Independence: Independent with assistive device(s)         Comments: Mod indep with SPC ("most of the time") for ADLs, household and limited community distances.  Ambulates to/from cafe on property (200-250' distance required from parking spot) for lunch daily.  Personal aide daily from 1000-1600 and 1100-0800.  Denies fall history.  Swims 3days/week and stays active as able.     Hand Dominance        Extremity/Trunk Assessment   Upper Extremity Assessment Upper Extremity Assessment: Overall WFL for tasks assessed  Lower Extremity Assessment Lower Extremity Assessment: Overall WFL for tasks assessed(grossly 4/5 throughout)       Communication   Communication: HOH  Cognition Arousal/Alertness: Awake/alert Behavior During Therapy: WFL for tasks assessed/performed Overall Cognitive Status: Within Functional Limits for tasks assessed                                         General Comments      Exercises     Assessment/Plan    PT Assessment Patient needs continued PT services  PT Problem List Decreased balance;Decreased activity tolerance;Decreased mobility;Pain       PT Treatment Interventions DME instruction;Gait training;Functional mobility training;Therapeutic activities;Therapeutic exercise;Balance training;Cognitive remediation;Patient/family education    PT Goals (Current goals can be found in the Care Plan section)  Acute Rehab PT Goals Patient Stated Goal: to consider rehab at [facility] to help take care of this drain PT Goal Formulation: With patient/family Time For Goal Achievement: 11/23/18 Potential to Achieve Goals: Good    Frequency Min 2X/week   Barriers to discharge        Co-evaluation               AM-PAC PT "6 Clicks" Mobility  Outcome Measure Help needed turning from your back to your side while in a flat bed without using bedrails?: None Help needed moving from lying on your back to sitting on the side of a flat bed without using bedrails?: None Help needed moving to and from a bed to a chair (including a wheelchair)?: A Little Help needed standing up from a chair using your arms (e.g., wheelchair or bedside chair)?: A Little Help needed to walk in hospital room?: A Little Help needed climbing 3-5 steps with a railing? : A Little 6 Click Score: 20    End of Session Equipment Utilized During Treatment: Gait belt Activity Tolerance: Patient tolerated treatment well Patient left: in bed;with call bell/phone within reach;with bed alarm set;with family/visitor present Nurse Communication: Mobility status PT Visit Diagnosis: Difficulty in walking, not elsewhere classified (R26.2);Muscle weakness (generalized) (M62.81)    Time: 3545-6256 PT Time Calculation (min) (ACUTE ONLY): 17 min   Charges:   PT Evaluation $PT Eval Moderate Complexity: 1 Mod          Jadda Hunsucker H. Manson Passey, PT,  DPT, NCS 11/09/18, 4:00 PM [phone]

## 2018-11-10 LAB — BASIC METABOLIC PANEL
Anion gap: 8 (ref 5–15)
BUN: 18 mg/dL (ref 8–23)
CHLORIDE: 103 mmol/L (ref 98–111)
CO2: 24 mmol/L (ref 22–32)
Calcium: 8.5 mg/dL — ABNORMAL LOW (ref 8.9–10.3)
Creatinine, Ser: 0.73 mg/dL (ref 0.61–1.24)
GFR calc Af Amer: >60 mL/min (ref >60)
GFR calc non Af Amer: >60 mL/min (ref >60)
Glucose, Bld: 179 mg/dL — ABNORMAL HIGH (ref 70–99)
Potassium: 5.1 mmol/L (ref 3.5–5.1)
Sodium: 135 mmol/L (ref 135–145)

## 2018-11-10 LAB — URINALYSIS, ROUTINE W REFLEX MICROSCOPIC
BILIRUBIN URINE: NEGATIVE
Glucose, UA: NEGATIVE mg/dL
HGB URINE DIPSTICK: NEGATIVE
Ketones, ur: NEGATIVE mg/dL
Leukocytes,Ua: NEGATIVE
Nitrite: NEGATIVE
Protein, ur: NEGATIVE mg/dL
Specific Gravity, Urine: 1.01 (ref 1.005–1.030)
pH: 7 (ref 5.0–8.0)

## 2018-11-10 LAB — CBC WITH DIFFERENTIAL/PLATELET
Abs Immature Granulocytes: 0.09 10*3/uL — ABNORMAL HIGH (ref 0.00–0.07)
Basophils Absolute: 0 10*3/uL (ref 0.0–0.1)
Basophils Relative: 0 %
Eosinophils Absolute: 0.2 10*3/uL (ref 0.0–0.5)
Eosinophils Relative: 2 %
HCT: 37.4 % — ABNORMAL LOW (ref 39.0–52.0)
HEMOGLOBIN: 12.4 g/dL — AB (ref 13.0–17.0)
IMMATURE GRANULOCYTES: 1 %
LYMPHS PCT: 6 %
Lymphs Abs: 0.7 10*3/uL (ref 0.7–4.0)
MCH: 29.7 pg (ref 26.0–34.0)
MCHC: 33.2 g/dL (ref 30.0–36.0)
MCV: 89.5 fL (ref 80.0–100.0)
Monocytes Absolute: 1 10*3/uL (ref 0.1–1.0)
Monocytes Relative: 9 %
Neutro Abs: 8.3 10*3/uL — ABNORMAL HIGH (ref 1.7–7.7)
Neutrophils Relative %: 82 %
Platelets: 227 10*3/uL (ref 150–400)
RBC: 4.18 MIL/uL — ABNORMAL LOW (ref 4.22–5.81)
RDW: 12.7 % (ref 11.5–15.5)
WBC: 10.3 10*3/uL (ref 4.0–10.5)
nRBC: 0 % (ref 0.0–0.2)

## 2018-11-10 LAB — GLUCOSE, CAPILLARY
Glucose-Capillary: 153 mg/dL — ABNORMAL HIGH (ref 70–99)
Glucose-Capillary: 169 mg/dL — ABNORMAL HIGH (ref 70–99)
Glucose-Capillary: 92 mg/dL (ref 70–99)
Glucose-Capillary: 148 mg/dL — ABNORMAL HIGH (ref 70–99)

## 2018-11-10 LAB — MAGNESIUM: Magnesium: 1.9 mg/dL (ref 1.7–2.4)

## 2018-11-10 LAB — PHOSPHORUS: Phosphorus: 2.7 mg/dL (ref 2.5–4.6)

## 2018-11-10 MED ORDER — ASPIRIN EC 81 MG PO TBEC
81.0000 mg | DELAYED_RELEASE_TABLET | Freq: Every day | ORAL | Status: DC
  Administered 2018-11-10 – 2018-11-12 (×3): 81 mg via ORAL
  Filled 2018-11-10 (×3): qty 1

## 2018-11-10 MED ORDER — GLIPIZIDE 5 MG PO TABS
2.5000 mg | ORAL_TABLET | Freq: Every day | ORAL | Status: DC
  Administered 2018-11-10 – 2018-11-12 (×3): 2.5 mg via ORAL
  Filled 2018-11-10 (×3): qty 0.5

## 2018-11-10 MED ORDER — DOCUSATE SODIUM 100 MG PO CAPS
100.0000 mg | ORAL_CAPSULE | Freq: Two times a day (BID) | ORAL | Status: DC
  Administered 2018-11-10 – 2018-11-12 (×5): 100 mg via ORAL
  Filled 2018-11-10 (×5): qty 1

## 2018-11-10 NOTE — Progress Notes (Signed)
PT Cancellation Note  Patient Details Name: Datavious Kyger MRN: [id number] DOB: Mar 14, 1919   Cancelled Treatment:    Reason Eval/Treat Not Completed: Other (comment)   Upon arrival in room, pt had just returned from walking 1 lap around unit with son and supervision.  Declined further intervention at this time.  Will attempt again as schedule allows.   Danielle Dess 11/10/2018, 9:31 AM

## 2018-11-10 NOTE — Progress Notes (Signed)
Physical Therapy Treatment Patient Details Name: Benjamin Parks MRN: [id number] DOB: 1919/05/26 Today's Date: 11/10/2018    History of Present Illness presented to ER secondary to abdominal pain, nausea; admitted for management of sigmoid diverticulitis, s/p CT guided drainage of diverticular abscess with JP drain placement (2/21)    PT Comments    Stood and was able to ambulate around unit with RW and min guard/supervision.  Slow but steady gait.    Follow Up Recommendations  Home health PT     Equipment Recommendations       Recommendations for Other Services       Precautions / Restrictions Precautions Precautions: Fall Precaution Comments: LLQ JP drain Restrictions Weight Bearing Restrictions: No    Mobility  Bed Mobility               General bed mobility comments: seated in recliner beginning of session; returned to bed end of session  Transfers Overall transfer level: Needs assistance Equipment used: Rolling walker (2 wheeled) Transfers: Sit to/from Stand Sit to Stand: Supervision;Min guard            Ambulation/Gait Ambulation/Gait assistance: Supervision;Min guard Gait Distance (Feet): 200 Feet Assistive device: Rolling walker (2 wheeled) Gait Pattern/deviations: Step-through pattern;Trunk flexed         Stairs             Wheelchair Mobility    Modified Rankin (Stroke Patients Only)       Balance Overall balance assessment: Needs assistance Sitting-balance support: No upper extremity supported;Feet supported Sitting balance-Leahy Scale: Good     Standing balance support: Bilateral upper extremity supported Standing balance-Leahy Scale: Fair                              Cognition Arousal/Alertness: Awake/alert Behavior During Therapy: WFL for tasks assessed/performed Overall Cognitive Status: Within Functional Limits for tasks assessed                                        Exercises       General Comments        Pertinent Vitals/Pain Pain Assessment: Faces Faces Pain Scale: Hurts a little bit Pain Location: abdominal pain  Pain Descriptors / Indicators: Grimacing;Guarding Pain Intervention(s): Limited activity within patient's tolerance;Monitored during session    Home Living                      Prior Function            PT Goals (current goals can now be found in the care plan section) Progress towards PT goals: Progressing toward goals    Frequency    Min 2X/week      PT Plan Current plan remains appropriate    Co-evaluation              AM-PAC PT "6 Clicks" Mobility   Outcome Measure  Help needed turning from your back to your side while in a flat bed without using bedrails?: None Help needed moving from lying on your back to sitting on the side of a flat bed without using bedrails?: None Help needed moving to and from a bed to a chair (including a wheelchair)?: A Little Help needed standing up from a chair using your arms (e.g., wheelchair or bedside chair)?: A Little Help needed to walk in hospital  room?: A Little Help needed climbing 3-5 steps with a railing? : A Little 6 Click Score: 20    End of Session Equipment Utilized During Treatment: Gait belt Activity Tolerance: Patient tolerated treatment well Patient left: in chair;with call bell/phone within reach;with chair alarm set;with family/visitor present         Time: 9417-4081 PT Time Calculation (min) (ACUTE ONLY): 8 min  Charges:  $Gait Training: 8-22 mins                     Danielle Dess, PTA 11/10/18, 11:26 AM

## 2018-11-10 NOTE — Progress Notes (Addendum)
Initial Nutrition Assessment  DOCUMENTATION CODES:   Non-severe (moderate) malnutrition in context of social or environmental circumstances  INTERVENTION:   Ensure Enlive po BID, each supplement provides 350 kcal and 20 grams of protein  NUTRITION DIAGNOSIS:   Moderate Malnutrition related to social / environmental circumstances(advanced age ) as evidenced by moderate fat depletion, moderate to severe muscle depletions.  GOAL:   Patient will meet greater than or equal to 90% of their needs  MONITOR:   PO intake, Supplement acceptance, Labs, Weight trends, Skin, I & O's  REASON FOR ASSESSMENT:   Consult Assessment of nutrition requirement/status  ASSESSMENT:   85 y/o male with h/o DM, NSTEMI, stroke admitted with perforated sigmoid diverticulitis s/p IR drain placement 2/21   Met with pt and caregiver in room today. Pt reports good appetite and oral intake at baseline; pt reports drinking 2 Ensure per day at home. Pt reports poor appetite for several days pta but reports his appetite is improving now. Pt reports eating a bowl of broth, a cup of jello and drinking some coffee for breakfast this morning. Pt also reports drinking an Ensure. Pt reports passing some flatus today but states "not as much as I want to". No BM since 2/23. Pt reports abdominal soreness but reports this is improving. Pt appears to be distended in his abdomen but RD did not palpate as to not worsen pt's discomfort. Pt denies any nausea or vomiting. Pt advanced to soft diet today. RD educated pt on soft diet and encouraged good protein choices with meals. Pt with fat and muscle depletions likely r/t advanced age. Per chart, pt is weight stable pta. Abdominal drain with 43m output. Recommend continue Ensure supplements to help pt meet his estimated needs.   Medications reviewed and include: aspirin, plavix, colace, glipizide, insulin, ocuvite, protonix   Labs reviewed: K 5.1 wnl, P 2.7 wnl, Mg 1.9 wnl cbgs-  144, 219, 121, 181, 153 x 24 hrs  NUTRITION - FOCUSED PHYSICAL EXAM:    Most Recent Value  Orbital Region  Mild depletion  Upper Arm Region  Moderate depletion  Thoracic and Lumbar Region  Mild depletion  Buccal Region  Mild depletion  Temple Region  Mild depletion  Clavicle Bone Region  Mild depletion  Clavicle and Acromion Bone Region  Mild depletion  Scapular Bone Region  Mild depletion  Dorsal Hand  Moderate depletion  Patellar Region  Severe depletion  Anterior Thigh Region  Severe depletion  Posterior Calf Region  Severe depletion  Edema (RD Assessment)  Mild  Hair  Reviewed  Eyes  Reviewed  Mouth  Reviewed  Skin  Reviewed  Nails  Reviewed     Diet Order:   Diet Order            DIET SOFT Room service appropriate? Yes; Fluid consistency: Thin  Diet effective now             EDUCATION NEEDS:   Education needs have been addressed  Skin:  Skin Assessment: Reviewed RN Assessment(ecchymosis )  Last BM:  2/23- type 3  Height:   Ht Readings from Last 1 Encounters:  11/04/18 _0  (1.778 m)    Weight:   Wt Readings from Last 1 Encounters:  11/09/18 77 kg    Ideal Body Weight:  75.4 kg  BMI:  Body mass index is 24.36 kg/m.  Estimated Nutritional Needs:   Kcal:  1800-2100kcal/day   Protein:  93-108g/day   Fluid:  1.9L/day   CKoleen DistanceMS,  RD, LDN Pager #- [phone] Office#- [phone] After Hours Pager: [phone]

## 2018-11-11 ENCOUNTER — Inpatient Hospital Stay: Payer: Self-pay

## 2018-11-11 DIAGNOSIS — B961 Klebsiella pneumoniae [K. pneumoniae] as the cause of diseases classified elsewhere: Secondary | ICD-10-CM

## 2018-11-11 DIAGNOSIS — Z881 Allergy status to other antibiotic agents status: Secondary | ICD-10-CM

## 2018-11-11 DIAGNOSIS — E119 Type 2 diabetes mellitus without complications: Secondary | ICD-10-CM

## 2018-11-11 DIAGNOSIS — Z7984 Long term (current) use of oral hypoglycemic drugs: Secondary | ICD-10-CM

## 2018-11-11 DIAGNOSIS — B9689 Other specified bacterial agents as the cause of diseases classified elsewhere: Secondary | ICD-10-CM

## 2018-11-11 DIAGNOSIS — K5720 Diverticulitis of large intestine with perforation and abscess without bleeding: Principal | ICD-10-CM

## 2018-11-11 DIAGNOSIS — Z888 Allergy status to other drugs, medicaments and biological substances status: Secondary | ICD-10-CM

## 2018-11-11 DIAGNOSIS — Z95828 Presence of other vascular implants and grafts: Secondary | ICD-10-CM

## 2018-11-11 DIAGNOSIS — Z87891 Personal history of nicotine dependence: Secondary | ICD-10-CM

## 2018-11-11 DIAGNOSIS — K632 Fistula of intestine: Secondary | ICD-10-CM

## 2018-11-11 DIAGNOSIS — E440 Moderate protein-calorie malnutrition: Secondary | ICD-10-CM

## 2018-11-11 DIAGNOSIS — I10 Essential (primary) hypertension: Secondary | ICD-10-CM

## 2018-11-11 DIAGNOSIS — Z978 Presence of other specified devices: Secondary | ICD-10-CM

## 2018-11-11 DIAGNOSIS — Z1624 Resistance to multiple antibiotics: Secondary | ICD-10-CM

## 2018-11-11 DIAGNOSIS — B9620 Unspecified Escherichia coli [E. coli] as the cause of diseases classified elsewhere: Secondary | ICD-10-CM

## 2018-11-11 DIAGNOSIS — Z79899 Other long term (current) drug therapy: Secondary | ICD-10-CM

## 2018-11-11 LAB — BASIC METABOLIC PANEL
Anion gap: 8 (ref 5–15)
BUN: 23 mg/dL (ref 8–23)
CALCIUM: 8.5 mg/dL — AB (ref 8.9–10.3)
CO2: 23 mmol/L (ref 22–32)
Chloride: 103 mmol/L (ref 98–111)
Creatinine, Ser: 0.9 mg/dL (ref 0.61–1.24)
GFR calc Af Amer: >60 mL/min (ref >60)
GFR calc non Af Amer: >60 mL/min (ref >60)
Glucose, Bld: 168 mg/dL — ABNORMAL HIGH (ref 70–99)
Potassium: 4.8 mmol/L (ref 3.5–5.1)
Sodium: 134 mmol/L — ABNORMAL LOW (ref 135–145)

## 2018-11-11 LAB — CBC WITH DIFFERENTIAL/PLATELET
Abs Immature Granulocytes: 0.1 10*3/uL — ABNORMAL HIGH (ref 0.00–0.07)
Basophils Absolute: 0 10*3/uL (ref 0.0–0.1)
Basophils Relative: 0 %
Eosinophils Absolute: 0.3 10*3/uL (ref 0.0–0.5)
Eosinophils Relative: 3 %
HCT: 38.8 % — ABNORMAL LOW (ref 39.0–52.0)
Hemoglobin: 12.8 g/dL — ABNORMAL LOW (ref 13.0–17.0)
Immature Granulocytes: 1 %
Lymphocytes Relative: 10 %
Lymphs Abs: 1 10*3/uL (ref 0.7–4.0)
MCH: 30 pg (ref 26.0–34.0)
MCHC: 33 g/dL (ref 30.0–36.0)
MCV: 91.1 fL (ref 80.0–100.0)
Monocytes Absolute: 1 10*3/uL (ref 0.1–1.0)
Monocytes Relative: 11 %
Neutro Abs: 7 10*3/uL (ref 1.7–7.7)
Neutrophils Relative %: 75 %
PLATELETS: 240 10*3/uL (ref 150–400)
RBC: 4.26 MIL/uL (ref 4.22–5.81)
RDW: 12.8 % (ref 11.5–15.5)
WBC: 9.4 10*3/uL (ref 4.0–10.5)
nRBC: 0 % (ref 0.0–0.2)

## 2018-11-11 LAB — GLUCOSE, CAPILLARY
Glucose-Capillary: 130 mg/dL — ABNORMAL HIGH (ref 70–99)
Glucose-Capillary: 119 mg/dL — ABNORMAL HIGH (ref 70–99)
Glucose-Capillary: 151 mg/dL — ABNORMAL HIGH (ref 70–99)
Glucose-Capillary: 180 mg/dL — ABNORMAL HIGH (ref 70–99)

## 2018-11-11 LAB — MAGNESIUM: Magnesium: 1.9 mg/dL (ref 1.7–2.4)

## 2018-11-11 LAB — PHOSPHORUS: PHOSPHORUS: 2.7 mg/dL (ref 2.5–4.6)

## 2018-11-11 MED ORDER — SODIUM CHLORIDE 0.9% FLUSH
10.0000 mL | INTRAVENOUS | Status: DC | PRN
  Administered 2018-11-11: 20 mL
  Filled 2018-11-11: qty 40

## 2018-11-11 MED ORDER — CIPROFLOXACIN HCL 500 MG PO TABS
500.0000 mg | ORAL_TABLET | Freq: Two times a day (BID) | ORAL | Status: DC
  Administered 2018-11-11: 500 mg via ORAL
  Filled 2018-11-11: qty 1

## 2018-11-11 MED ORDER — METRONIDAZOLE 500 MG PO TABS
500.0000 mg | ORAL_TABLET | Freq: Three times a day (TID) | ORAL | Status: DC
  Administered 2018-11-11 – 2018-11-12 (×3): 500 mg via ORAL
  Filled 2018-11-11 (×5): qty 1

## 2018-11-11 MED ORDER — HYDROCORTISONE 1 % EX CREA
TOPICAL_CREAM | Freq: Three times a day (TID) | CUTANEOUS | Status: DC | PRN
  Administered 2018-11-11 – 2018-11-12 (×2): via TOPICAL
  Filled 2018-11-11: qty 28

## 2018-11-11 MED ORDER — SODIUM CHLORIDE 0.9 % IV SOLN
1.0000 g | INTRAVENOUS | Status: DC
  Administered 2018-11-11: 1 g via INTRAVENOUS
  Filled 2018-11-11: qty 10
  Filled 2018-11-11: qty 1

## 2018-11-11 NOTE — Progress Notes (Signed)
Peripherally Inserted Central Catheter/Midline Placement  The IV Nurse has discussed with the patient and/or persons authorized to consent for the patient, the purpose of this procedure and the potential benefits and risks involved with this procedure.  The benefits include less needle sticks, lab draws from the catheter, and the patient may be discharged home with the catheter. Risks include, but not limited to, infection, bleeding, blood clot (thrombus formation), and puncture of an artery; nerve damage and irregular heartbeat and possibility to perform a PICC exchange if needed/ordered by physician.  Alternatives to this procedure were also discussed.  Bard Power PICC patient education guide, fact sheet on infection prevention and patient information card has been provided to patient /or left at bedside.    PICC/Midline Placement Documentation  PICC Single Lumen 11/11/18 PICC Right Basilic 40 cm 0 cm (Active)  Indication for Insertion or Continuance of Line Prolonged intravenous therapies 11/11/2018  1:18 PM  Exposed Catheter (cm) 0 cm 11/11/2018  1:18 PM  Site Assessment Clean;Dry;Intact 11/11/2018  1:18 PM  Line Status Flushed;Blood return noted;Saline locked 11/11/2018  1:18 PM  Dressing Status Clean;Antimicrobial disc in place;Intact;Dry 11/11/2018  1:18 PM  Dressing Change Due 11/18/18 11/11/2018  1:18 PM       Audrie Gallus 11/11/2018, 1:19 PM

## 2018-11-11 NOTE — Progress Notes (Signed)
Subjective:  CC:  Benjamin Parks is a 85 y.o. male  Hospital stay day 7,   sigmoid diverticulitis  HPI: Feeling better again today  ROS:  General: Denies weight loss, weight gain, fatigue, fevers, chills, and night sweats. Heart: Denies chest pain, palpitations, racing heart, irregular heartbeat, leg pain or swelling, and decreased activity tolerance. Respiratory: Denies breathing difficulty, shortness of breath, wheezing, cough, and sputum. GI: Denies change in appetite, heartburn, nausea, vomiting, constipation, diarrhea, and blood in stool. GU: Denies difficulty urinating, pain with urinating, urgency, frequency, blood in urine.   Objective:      Temp:  [97.5 F (36.4 C)-98.8 F (37.1 C)] 98.4 F (36.9 C) (02/27 0810) Pulse Rate:  [53-73] 65 (02/27 0810) Resp:  [18-20] 20 (02/27 0653) BP: (112-159)/(53-73) 120/67 (02/27 0810) SpO2:  [94 %-99 %] 98 % (02/27 0810)     Height: 5\' 10"  (177.8 cm) Weight: 77 kg BMI (Calculated): 24.36   Intake/Output this shift:   Intake/Output Summary (Last 24 hours) at 11/11/2018 1116 Last data filed at 11/11/2018 0944 Gross per 24 hour  Intake 490 ml  Output 20 ml  Net 470 ml    11ml of feculant drainage in JP bulb    Constitutional :  alert, cooperative, appears stated age and no distress  Respiratory:  clear to auscultation bilaterally  Cardiovascular:  regular rate and rhythm  Gastrointestinal: soft, persistant supraumbilical tenderness, but continuing to  improve from last exam.   Skin: Cool and moist.   Psychiatric: Normal affect, non-agitated, not confused       LABS:  CMP Latest Ref Rng & Units 11/11/2018 11/10/2018 11/09/2018  Glucose 70 - 99 mg/dL 517(O) 160(V) 371(G)  BUN 8 - 23 mg/dL 23 18 17   Creatinine 0.61 - 1.24 mg/dL 6.26 9.48 5.46  Sodium 135 - 145 mmol/L 134(L) 135 134(L)  Potassium 3.5 - 5.1 mmol/L 4.8 5.1 4.6  Chloride 98 - 111 mmol/L 103 103 103  CO2 22 - 32 mmol/L 23 24 27   Calcium 8.9 - 10.3 mg/dL 2.7(O)  3.5(K) 8.1(L)  Total Protein 6.5 - 8.1 g/dL - - -  Total Bilirubin 0.3 - 1.2 mg/dL - - -  Alkaline Phos 38 - 126 U/L - - -  AST 15 - 41 U/L - - -  ALT 0 - 44 U/L - - -   CBC Latest Ref Rng & Units 11/11/2018 11/10/2018 11/09/2018  WBC 4.0 - 10.5 K/uL 9.4 10.3 12.6(H)  Hemoglobin 13.0 - 17.0 g/dL 12.8(L) 12.4(L) 11.9(L)  Hematocrit 39.0 - 52.0 % 38.8(L) 37.4(L) 35.7(L)  Platelets 150 - 400 K/uL 240 227 217    RADS: n/a Assessment:   Persistent sigmoid diverticulitis. Tolerating soft diet.  ID consult for abx care once d/c.  Likely PICC and IV abx and oral abx per discussion.  Will monitor in house one more day due to improving but still present pain and feculant drainage.  Likely will go home with drain as well.

## 2018-11-11 NOTE — Consult Note (Signed)
NAME: Benjamin Parks  DOB: 01/24/1919  MRN: [id number]  Date/Time: 11/11/2018 2:53 PM  REQUESTING PROVIDER: Dr. Tonna Boehringer Subjective:  REASON FOR CONSULT: Perforated diverticulitis ? Benjamin Parks is a 85 y.o. male with a history of hypertension, CAD, diabetes mellitus, diverticulitis presents to the ED on 11/04/2018 with abdominal pain.  Patient was recently in [facility] between 10/30/2018 until 11/01/2018 for abdominal pain and was diagnosed with perforation of sigmoid colon  diverticulitis.  He was initially treated with IV antibiotics and then was discharged home on p.o. Augmentin for 7 days. He returned on 11/04/2018 with severe abdominal pain.  He underwent CT abdomen which showed perforated acute sigmoid diverticulitis with increased air extending into the mesentery and developing fistulas in the pericolonic abscess.  He was started on meropenem.  He was seen by the surgeon and IR placed peridiverticular drain on 11/05/2018 .  I am asked to see the patient for antibiotic management. The culture from the drain has E. coli, Klebsiella and anaerobes. Patient is feeling better but still has lower abdominal pain. Does not have any fever. He has been taking liquid diet Past Medical History:  Diagnosis Date  . Diabetes mellitus without complication (HCC)   . History of heart attack   . Hyperlipidemia   . Hypertension   . PVC (premature ventricular contraction)   . Right bundle branch block   . Stroke [facility])     Past Surgical History:  Procedure Laterality Date  . APPENDECTOMY    . CARDIAC CATHETERIZATION N/A 11/08/2015   Procedure: Left Heart Cath and Coronary Angiography;  Surgeon: Dalia Heading, MD;  Location: [facility] INVASIVE CV LAB;  Service: Cardiovascular;  Laterality: N/A;  . HERNIA REPAIR    . none    . 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 Heart attack Mother      deceased  . Heart attack Father        deceased   Allergies  Allergen Reactions  . Azithromycin   . Erythromycin Base     Other reaction(s): UNKNOWN  . Metformin And Related Other (See Comments)    unknown   ? Current Facility-Administered Medications  Medication Dose Route Frequency Provider Last Rate Last Dose  . 0.9 %  sodium chloride infusion   Intravenous PRN Tonna Boehringer, Isami, DO 10 mL/hr at 11/10/18 0639 250 mL at 11/10/18 0639  . aspirin EC tablet 81 mg  81 mg Oral Daily Sakai, Isami, DO   81 mg at 11/11/18 0818  . atorvastatin (LIPITOR) tablet 40 mg  40 mg Oral q1800 Sakai, Isami, DO   40 mg at 11/10/18 1641  . cefTRIAXone (ROCEPHIN) 1 g in sodium chloride 0.9 % 100 mL IVPB  1 g Intravenous Q24H Sakai, Isami, DO 200 mL/hr at 11/11/18 1130 1 g at 11/11/18 1130  . clopidogrel (PLAVIX) tablet 75 mg  75 mg Oral Daily Sakai, Isami, DO   75 mg at 11/11/18 0817  . dextrose 50 % solution 12.5 g  12.5 g Intravenous PRN Sakai, Isami, DO   12.5 g at 11/06/18 1640  . diclofenac sodium (VOLTAREN) 1 % transdermal gel 2 g  2 g Topical BID PRN Tonna Boehringer, Isami, DO      . docusate sodium (COLACE) capsule 100 mg  100 mg Oral BID Sakai, Isami, DO   100 mg at 11/11/18 0819  . feeding supplement (  ENSURE ENLIVE) (ENSURE ENLIVE) liquid 237 mL  237 mL Oral BID BM Sakai, Isami, DO   237 mL at 11/11/18 1254  . finasteride (PROSCAR) tablet 5 mg  5 mg Oral Daily Sakai, Isami, DO   5 mg at 11/11/18 0817  . glipiZIDE (GLUCOTROL) tablet 2.5 mg  2.5 mg Oral QAC breakfast Sakai, Isami, DO   2.5 mg at 11/11/18 0816  . HYDROcodone-acetaminophen (NORCO/VICODIN) 5-325 MG per tablet 1 tablet  1 tablet Oral Q6H PRN Tonna Boehringer, Isami, DO   1 tablet at 11/09/18 1705  . hydrocortisone cream 1 %   Topical TID PRN Tonna Boehringer, Isami, DO      . insulin aspart (novoLOG) injection 0-15 Units  0-15 Units Subcutaneous TID WC Sakai, Isami, DO   2 Units at 11/11/18 0815  . lisinopril (PRINIVIL,ZESTRIL) tablet 2.5 mg  2.5 mg Oral Daily Sakai,  Isami, DO   2.5 mg at 11/11/18 0817  . metoprolol tartrate (LOPRESSOR) tablet 25 mg  25 mg Oral Daily Sakai, Isami, DO   25 mg at 11/11/18 0819  . metroNIDAZOLE (FLAGYL) tablet 500 mg  500 mg Oral Q8H Sakai, Isami, DO   500 mg at 11/11/18 1126  . multivitamin-lutein (OCUVITE-LUTEIN) capsule 1 capsule  1 capsule Oral Daily Sakai, Isami, DO   1 capsule at 11/11/18 0817  . ondansetron (ZOFRAN-ODT) disintegrating tablet 4 mg  4 mg Oral Q6H PRN Tonna Boehringer, Isami, DO       Or  . ondansetron (ZOFRAN) injection 4 mg  4 mg Intravenous Q6H PRN Sakai, Isami, DO      . pantoprazole (PROTONIX) injection 40 mg  40 mg Intravenous QHS Sakai, Isami, DO   40 mg at 11/10/18 2157  . simethicone (MYLICON) chewable tablet 80 mg  80 mg Oral QID PRN Tonna Boehringer, Isami, DO   80 mg at 11/09/18 1207  . sodium chloride 0.9 % bolus 500 mL  500 mL Intravenous Once Sakai, Isami, DO      . sodium chloride flush (NS) 0.9 % injection 10-40 mL  10-40 mL Intracatheter PRN Sakai, Isami, DO      . sodium chloride flush (NS) 0.9 % injection 5 mL  5 mL Intracatheter Q8H Irish Lack, MD   5 mL at 11/11/18 1254  . triamcinolone cream (KENALOG) 0.1 %   Topical BID PRN Tonna Boehringer, Isami, DO         Abtx:  Anti-infectives (From admission, onward)   Start     Dose/Rate Route Frequency Ordered Stop   11/11/18 1200  metroNIDAZOLE (FLAGYL) tablet 500 mg     500 mg Oral Every 8 hours 11/11/18 0941     11/11/18 1100  cefTRIAXone (ROCEPHIN) 1 g in sodium chloride 0.9 % 100 mL IVPB     1 g 200 mL/hr over 30 Minutes Intravenous Every 24 hours 11/11/18 1013     11/11/18 1000  ciprofloxacin (CIPRO) tablet 500 mg  Status:  Discontinued     500 mg Oral 2 times daily 11/11/18 0941 11/11/18 1013   11/06/18 1400  meropenem (MERREM) 1 g in sodium chloride 0.9 % 100 mL IVPB  Status:  Discontinued     1 g 200 mL/hr over 30 Minutes Intravenous Every 8 hours 11/06/18 1252 11/11/18 0941   11/04/18 2200  meropenem (MERREM) 1 g in sodium chloride 0.9 % 100 mL IVPB   Status:  Discontinued     1 g 200 mL/hr over 30 Minutes Intravenous Every 12 hours 11/04/18 2139 11/06/18 1252   11/04/18 1700  ciprofloxacin (CIPRO) IVPB 400 mg     400 mg 200 mL/hr over 60 Minutes Intravenous  Once 11/04/18 1656 11/04/18 1853   11/04/18 1700  metroNIDAZOLE (FLAGYL) IVPB 500 mg     500 mg 100 mL/hr over 60 Minutes Intravenous  Once 11/04/18 1656 11/04/18 2137      REVIEW OF SYSTEMS:  Const: negative fever, negative chills, negative weight loss Eyes: negative diplopia or visual changes, negative eye pain ENT: negative coryza, negative sore throat Resp: negative cough, hemoptysis, dyspnea Cards: negative for chest pain, palpitations, lower extremity edema GU: negative for frequency, dysuria and hematuria GI: Has abdominal pain, diarrhea, no bleeding, no constipation Skin: negative for rash and pruritus Heme: negative for easy bruising and gum/nose bleeding MS: negative for myalgias, arthralgias, has back pain and muscle weakness Neurolo:negative for headaches, dizziness, vertigo, memory problems  Psych: negative for feelings of anxiety, depression  Endocrine: No polyuria or polydipsia Allergy/Immunology-allergic to azithromycin and metformin: Objective:  VITALS:  BP 120/67 (BP Location: Left Arm)   Pulse 65   Temp 98.4 F (36.9 C) (Oral)   Resp 20   Ht  (1.778 m)   Wt 77 kg   SpO2 98%   BMI 24.36 kg/m  PHYSICAL EXAM:  General: Alert, cooperative, no distress, appears younger for his age.  Head: Normocephalic, without obvious abnormality, atraumatic. Eyes: Conjunctivae clear, anicteric sclerae. Pupils are equal ENT Nares normal. No drainage or sinus tenderness. Lips, mucosa, and tongue normal. No Thrush Neck: Supple, symmetrical, no adenopathy, thyroid: non tender no carotid bruit and no JVD. Back: No CVA tenderness. Lungs: Bilateral air entry Heart: S1-S2 Abdomen: Soft, -tender,not distended. Bowel sounds normal.  Left lower quadrant drain  present no masses Extremities: atraumatic, no cyanosis. No edema. No clubbing.  Right arm PICC Skin: No rashes or lesions. Or bruising Lymph: Cervical, supraclavicular normal. Neurologic: Grossly non-focal Pertinent Labs Lab Results CBC    Component Value Date/Time   WBC 9.4 11/11/2018 0447   RBC 4.26 11/11/2018 0447   HGB 12.8 (L) 11/11/2018 0447   HGB 14.1 12/08/2013 1528   HCT 38.8 (L) 11/11/2018 0447   HCT 41.6 12/08/2013 1528   PLT 240 11/11/2018 0447   PLT 180 12/08/2013 1528   MCV 91.1 11/11/2018 0447   MCV 96 12/08/2013 1528   MCH 30.0 11/11/2018 0447   MCHC 33.0 11/11/2018 0447   RDW 12.8 11/11/2018 0447   RDW 13.5 12/08/2013 1528   LYMPHSABS 1.0 11/11/2018 0447   LYMPHSABS 0.6 (L) 12/05/2012 0457   MONOABS 1.0 11/11/2018 0447   MONOABS 1.3 (H) 12/05/2012 0457   EOSABS 0.3 11/11/2018 0447   EOSABS 0.0 12/05/2012 0457   BASOSABS 0.0 11/11/2018 0447   BASOSABS 0.0 12/05/2012 0457    CMP Latest Ref Rng & Units 11/11/2018 11/10/2018 11/09/2018  Glucose 70 - 99 mg/dL 161(W) 960(A) 540(J)  BUN 8 - 23 mg/dL Creatinine 0.61 - 1.24 mg/dL 8.11 9.14 7.82  Sodium 135 - 145 mmol/L 134(L) 135 134(L)  Potassium 3.5 - 5.1 mmol/L 4.8 5.1 4.6  Chloride 98 - 111 mmol/L 103 103 103  CO2 22 - 32 mmol/L Calcium 8.9 - 10.3 mg/dL 9.5(A) 2.1(H) 8.1(L)  Total Protein 6.5 - 8.1 g/dL - - -  Total Bilirubin 0.3 - 1.2 mg/dL - - -  Alkaline Phos 38 - 126 U/L - - -  AST 15 - 41 U/L - - -  ALT 0 - 44 U/L - - -  Microbiology: Recent Results (from the past 240 hour(s))  Aerobic/Anaerobic Culture (surgical/deep wound)     Status: None   Collection Time: 11/05/18  2:08 PM  Result Value Ref Range Status   Specimen Description   Final    WOUND Performed at [facility], [address], [place], [place] [address]    Special Requests   Final    Normal Performed at [facility], [address] [place], [place] [address]    Gram Stain   Final      ABUNDANT WBC PRESENT, PREDOMINANTLY PMN ABUNDANT GRAM POSITIVE COCCI MODERATE GRAM POSITIVE RODS MODERATE GRAM VARIABLE ROD    Culture   Final    ABUNDANT ESCHERICHIA COLI ABUNDANT KLEBSIELLA PNEUMONIAE ABUNDANT BACTEROIDES THETAIOTAOMICRON BETA LACTAMASE POSITIVE Performed at [facility], [address], [place], [place] [address]    Report Status 11/08/2018 FINAL  Final   Organism ID, Bacteria ESCHERICHIA COLI  Final   Organism ID, Bacteria KLEBSIELLA PNEUMONIAE  Final      Susceptibility   Escherichia coli - MIC*    AMPICILLIN >=32 RESISTANT Resistant     CEFAZOLIN <=4 SENSITIVE Sensitive     CEFEPIME <=1 SENSITIVE Sensitive     CEFTAZIDIME <=1 SENSITIVE Sensitive     CEFTRIAXONE <=1 SENSITIVE Sensitive     CIPROFLOXACIN >=4 RESISTANT Resistant     GENTAMICIN <=1 SENSITIVE Sensitive     IMIPENEM <=0.25 SENSITIVE Sensitive     TRIMETH/SULFA >=320 RESISTANT Resistant     AMPICILLIN/SULBACTAM >=32 RESISTANT Resistant     PIP/TAZO 32 INTERMEDIATE Intermediate     Extended ESBL NEGATIVE Sensitive     * ABUNDANT ESCHERICHIA COLI   Klebsiella pneumoniae - MIC*    AMPICILLIN >=32 RESISTANT Resistant     CEFAZOLIN <=4 SENSITIVE Sensitive     CEFEPIME <=1 SENSITIVE Sensitive     CEFTAZIDIME <=1 SENSITIVE Sensitive     CEFTRIAXONE <=1 SENSITIVE Sensitive     CIPROFLOXACIN >=4 RESISTANT Resistant     GENTAMICIN <=1 SENSITIVE Sensitive     IMIPENEM <=0.25 SENSITIVE Sensitive     TRIMETH/SULFA <=20 SENSITIVE Sensitive     AMPICILLIN/SULBACTAM >=32 RESISTANT Resistant     PIP/TAZO >=128 RESISTANT Resistant     Extended ESBL NEGATIVE Sensitive     * ABUNDANT KLEBSIELLA PNEUMONIAE    IMAGING RESULTS:  I have personally reviewed the films ? Impression/Recommendation ?85 year old male with perforated sigmoid diverticulitis ? Perforated sigmoid diverticulitis with peridiverticular abscess and fistula formation.  Currently has a drain. He has been treated with 7 days of  meropenem.  He needs adequate source control.  As he has extensive disease we will treat him with another week of ceftriaxone plus oral Flagyl.  He had E. coli, Klebsiella and anaerobes.  The E. coli and Klebsiella is resistant to ciprofloxacin and Augmentin and Bactrim.   Patient is not inclined to have surgery. ? __Diabetes mellitus On glipizide  Hypertension On lisinopril _________________________________________________ Discussed with patient, and requesting provider Note:  This document was prepared using Dragon voice recognition software and may include unintentional dictation errors.

## 2018-11-11 NOTE — Progress Notes (Signed)
PHARMACY CONSULT NOTE FOR:  OUTPATIENT  PARENTERAL ANTIBIOTIC THERAPY (OPAT)  Indication: diverticular abscess Regimen: ceftriaxone 2gm IV q24h and metronidazole 500mg  PO TID End date: 11/19/2018  IV antibiotic discharge orders are pended. To discharging provider:  please sign these orders via discharge navigator,  Select New Orders & click on the button choice - Manage This Unsigned Work.     Thank you for allowing pharmacy to be a part of this patient's care.  Juliette Alcide, PharmD, BCPS.   Work Cell: [phone] 11/11/2018 4:11 PM

## 2018-11-11 NOTE — Clinical Social Work Note (Signed)
Sue Lush at [facility] is aware that patient will be coming to the Healthcare side of [place]. The physician has stated patient will need intravenous antibiotics when he is discharged and CSW has let Sue Lush at [facility] be aware. York Spaniel MSW,LCSW [phone]

## 2018-11-11 NOTE — Care Management Important Message (Signed)
Copy of signed Medicare IM left with patient in room. 

## 2018-11-11 NOTE — Clinical Social Work Note (Signed)
CSW spoke with patient's son and confirmed when time for discharge patient would go to [facility]. York Spaniel MSW,LCSW [phone]

## 2018-11-11 NOTE — [place] FL2 (Signed)
[place] MEDICAID FL2 LEVEL OF CARE SCREENING TOOL     IDENTIFICATION  Patient Name: Benjamin Parks Birthdate: 12-Dec-1918 Sex: male Admission Date (Current Location): 11/04/2018  [place] and [place] Number:  Chiropodist and Address:  [facility], [address] [place], [place] [address]      Provider Number: [id number]  Attending Physician Name and Address:  Sung Amabile, DO  Relative Name and Phone Number:       Current Level of Care: Hospital Recommended Level of Care: Skilled Nursing Facility Prior Approval Number:    Date Approved/Denied:   PASRR Number: [id number]  Discharge Plan: SNF    Current Diagnoses: Patient Active Problem List   Diagnosis Date Noted  . Malnutrition of moderate degree 11/11/2018  . Diverticulitis large intestine 11/04/2018  . Sigmoid diverticulitis 10/30/2018  . SOBOE (shortness of breath on exertion) 06/17/2018  . BPH with obstruction/lower urinary tract symptoms 01/01/2017  . Hematuria 01/01/2017  . Benign essential HTN 11/16/2015  . CAD (coronary artery disease) 11/16/2015  . Mixed hyperlipidemia 11/16/2015  . Non-STEMI (non-ST elevated myocardial infarction) (HCC) 11/07/2015  . Community acquired pneumonia 11/07/2015  . NSTEMI (non-ST elevated myocardial infarction) (HCC) 11/07/2015  . Chronic cystitis 08/17/2013  . Elevated PSA 08/17/2013  . Gross hematuria 08/16/2012  . Incomplete emptying of bladder 08/16/2012  . Nodular prostate with urinary obstruction 08/16/2012  . Retention of urine 08/16/2012  . Urinary tract infection 08/16/2012    Orientation RESPIRATION BLADDER Height & Weight     Self, Situation, Place, Time  Normal Incontinent Weight: 169 lb 12.1 oz (77 kg) Height:  5\' 10"  (177.8 cm)  BEHAVIORAL SYMPTOMS/MOOD NEUROLOGICAL BOWEL NUTRITION STATUS  (none) (none) Continent Diet(regular)  AMBULATORY STATUS COMMUNICATION OF NEEDS Skin   Limited Assist Verbally Normal                     Personal Care Assistance Level of Assistance  Bathing, Dressing Bathing Assistance: Limited assistance Feeding assistance: Limited assistance Dressing Assistance: Limited assistance     Functional Limitations Info  Sight Sight Info: Impaired Hearing Info: Adequate Speech Info: Adequate    SPECIAL CARE FACTORS FREQUENCY  PT (By licensed PT)     PT Frequency: 5 OT Frequency: 5            Contractures Contractures Info: Not present    Additional Factors Info  Code Status Code Status Info: dnr Allergies Info: Azithromycin, Erythromycin Base, Metformin And Related           Current Medications (11/11/2018):  This is the current hospital active medication list Current Facility-Administered Medications  Medication Dose Route Frequency Provider Last Rate Last Dose  . 0.9 %  sodium chloride infusion   Intravenous PRN Sung Amabile, DO   Stopped at 11/10/18 0820  . aspirin EC tablet 81 mg  81 mg Oral Daily Sakai, Isami, DO   81 mg at 11/11/18 0818  . atorvastatin (LIPITOR) tablet 40 mg  40 mg Oral q1800 Sakai, Isami, DO   40 mg at 11/10/18 1641  . cefTRIAXone (ROCEPHIN) 1 g in sodium chloride 0.9 % 100 mL IVPB  1 g Intravenous Q24H Sakai, Isami, DO   Stopped at 11/11/18 1200  . clopidogrel (PLAVIX) tablet 75 mg  75 mg Oral Daily Sakai, Isami, DO   75 mg at 11/11/18 0817  . dextrose 50 % solution 12.5 g  12.5 g Intravenous PRN Sakai, Isami, DO   12.5 g at 11/06/18 1640  .  diclofenac sodium (VOLTAREN) 1 % transdermal gel 2 g  2 g Topical BID PRN Tonna Boehringer, Isami, DO      . docusate sodium (COLACE) capsule 100 mg  100 mg Oral BID Sakai, Isami, DO   100 mg at 11/11/18 0819  . feeding supplement (ENSURE ENLIVE) (ENSURE ENLIVE) liquid 237 mL  237 mL Oral BID BM Sakai, Isami, DO   237 mL at 11/11/18 1254  . finasteride (PROSCAR) tablet 5 mg  5 mg Oral Daily Sakai, Isami, DO   5 mg at 11/11/18 0817  . glipiZIDE (GLUCOTROL) tablet 2.5 mg  2.5 mg Oral QAC breakfast Sakai,  Isami, DO   2.5 mg at 11/11/18 0816  . HYDROcodone-acetaminophen (NORCO/VICODIN) 5-325 MG per tablet 1 tablet  1 tablet Oral Q6H PRN Tonna Boehringer, Isami, DO   1 tablet at 11/09/18 1705  . hydrocortisone cream 1 %   Topical TID PRN Tonna Boehringer, Isami, DO      . insulin aspart (novoLOG) injection 0-15 Units  0-15 Units Subcutaneous TID WC Sakai, Isami, DO   2 Units at 11/11/18 0815  . lisinopril (PRINIVIL,ZESTRIL) tablet 2.5 mg  2.5 mg Oral Daily Sakai, Isami, DO   2.5 mg at 11/11/18 0817  . metoprolol tartrate (LOPRESSOR) tablet 25 mg  25 mg Oral Daily Sakai, Isami, DO   25 mg at 11/11/18 0819  . metroNIDAZOLE (FLAGYL) tablet 500 mg  500 mg Oral Q8H Sakai, Isami, DO   500 mg at 11/11/18 1126  . multivitamin-lutein (OCUVITE-LUTEIN) capsule 1 capsule  1 capsule Oral Daily Sakai, Isami, DO   1 capsule at 11/11/18 0817  . ondansetron (ZOFRAN-ODT) disintegrating tablet 4 mg  4 mg Oral Q6H PRN Tonna Boehringer, Isami, DO       Or  . ondansetron (ZOFRAN) injection 4 mg  4 mg Intravenous Q6H PRN Sakai, Isami, DO      . pantoprazole (PROTONIX) injection 40 mg  40 mg Intravenous QHS Sakai, Isami, DO   40 mg at 11/10/18 2157  . simethicone (MYLICON) chewable tablet 80 mg  80 mg Oral QID PRN Tonna Boehringer, Isami, DO   80 mg at 11/09/18 1207  . sodium chloride 0.9 % bolus 500 mL  500 mL Intravenous Once Sakai, Isami, DO      . sodium chloride flush (NS) 0.9 % injection 10-40 mL  10-40 mL Intracatheter PRN Sakai, Isami, DO      . sodium chloride flush (NS) 0.9 % injection 5 mL  5 mL Intracatheter Q8H Irish Lack, MD   5 mL at 11/11/18 1254  . triamcinolone cream (KENALOG) 0.1 %   Topical BID PRN Sung Amabile, DO         Discharge Medications: Please see discharge summary for a list of discharge medications.  Relevant Imaging Results:  Relevant Lab Results:   Additional Information ss: [id number]  York Spaniel, LCSW

## 2018-11-11 NOTE — Progress Notes (Addendum)
Subjective:  CC:  Benjamin Parks is a 85 y.o. male  Hospital stay day 6,   sigmoid diverticulitis  HPI: Bloating improved, still with some pain.  ROS:  General: Denies weight loss, weight gain, fatigue, fevers, chills, and night sweats. Heart: Denies chest pain, palpitations, racing heart, irregular heartbeat, leg pain or swelling, and decreased activity tolerance. Respiratory: Denies breathing difficulty, shortness of breath, wheezing, cough, and sputum. GI: Denies change in appetite, heartburn, nausea, vomiting, constipation, diarrhea, and blood in stool. GU: Denies difficulty urinating, pain with urinating, urgency, frequency, blood in urine.   Objective:      Temp:  [97.5 F (36.4 C)-98.8 F (37.1 C)] 98.4 F (36.9 C) (02/27 0810) Pulse Rate:  [53-73] 65 (02/27 0810) Resp:  [18-20] 20 (02/27 0653) BP: (112-159)/(53-73) 120/67 (02/27 0810) SpO2:  [94 %-99 %] 98 % (02/27 0810)     Height: 5\' 10"  (177.8 cm) Weight: 77 kg BMI (Calculated): 24.36   Intake/Output this shift:   Intake/Output Summary (Last 24 hours) at 11/11/2018 1114 Last data filed at 11/11/2018 0944 Gross per 24 hour  Intake 490 ml  Output 20 ml  Net 470 ml    60ml of feculant drainage in JP bulb    Constitutional :  alert, cooperative, appears stated age and no distress  Respiratory:  clear to auscultation bilaterally  Cardiovascular:  regular rate and rhythm  Gastrointestinal: soft, persistant supraumbilical tenderness, slight improvement from last exam.   Skin: Cool and moist.   Psychiatric: Normal affect, non-agitated, not confused       LABS:  CMP Latest Ref Rng & Units 11/10/2018 11/09/2018  Glucose 70 - 99 mg/dL 193(X) 902(I)  BUN 8 - 23 mg/dL 18 17  Creatinine 0.97 - 1.24 mg/dL 3.53 2.99  Sodium 242 - 145 mmol/L 135 134(L)  Potassium 3.5 - 5.1 mmol/L 5.1 4.6  Chloride 98 - 111 mmol/L 103 103  CO2 22 - 32 mmol/L 24 27  Calcium 8.9 - 10.3 mg/dL 6.8(T) 8.1(L)  Total Protein 6.5 - 8.1 g/dL -  -  Total Bilirubin 0.3 - 1.2 mg/dL - -  Alkaline Phos 38 - 126 U/L - -  AST 15 - 41 U/L - -  ALT 0 - 44 U/L - -   CBC Latest Ref Rng & Units 11/10/2018 11/09/2018  WBC 4.0 - 10.5 K/uL 10.3 12.6(H)  Hemoglobin 13.0 - 17.0 g/dL 12.4(L) 11.9(L)  Hematocrit 39.0 - 52.0 % 37.4(L) 35.7(L)  Platelets 150 - 400 K/uL 227 217    RADS: n/a Assessment:   Persistent sigmoid diverticulitis.  Voiding without issue, having small BMs, passing flatus, but pain and leukocytosis improved, feculent drainage still persistent.  Will advance to diet and see how he does.  Continue iv abx forone more tday

## 2018-11-12 LAB — CBC WITH DIFFERENTIAL/PLATELET
Abs Immature Granulocytes: 0.11 10*3/uL — ABNORMAL HIGH (ref 0.00–0.07)
BASOS ABS: 0.1 10*3/uL (ref 0.0–0.1)
Basophils Relative: 1 %
EOS PCT: 3 %
Eosinophils Absolute: 0.2 10*3/uL (ref 0.0–0.5)
HCT: 38.9 % — ABNORMAL LOW (ref 39.0–52.0)
Hemoglobin: 12.8 g/dL — ABNORMAL LOW (ref 13.0–17.0)
Immature Granulocytes: 1 %
Lymphocytes Relative: 11 %
Lymphs Abs: 0.9 10*3/uL (ref 0.7–4.0)
MCH: 29.8 pg (ref 26.0–34.0)
MCHC: 32.9 g/dL (ref 30.0–36.0)
MCV: 90.7 fL (ref 80.0–100.0)
Monocytes Absolute: 0.9 10*3/uL (ref 0.1–1.0)
Monocytes Relative: 11 %
Neutro Abs: 5.8 10*3/uL (ref 1.7–7.7)
Neutrophils Relative %: 73 %
Platelets: 251 10*3/uL (ref 150–400)
RBC: 4.29 MIL/uL (ref 4.22–5.81)
RDW: 12.8 % (ref 11.5–15.5)
WBC: 8 10*3/uL (ref 4.0–10.5)
nRBC: 0 % (ref 0.0–0.2)

## 2018-11-12 LAB — BASIC METABOLIC PANEL
Anion gap: 7 (ref 5–15)
BUN: 28 mg/dL — ABNORMAL HIGH (ref 8–23)
CO2: 24 mmol/L (ref 22–32)
Calcium: 8.2 mg/dL — ABNORMAL LOW (ref 8.9–10.3)
Chloride: 103 mmol/L (ref 98–111)
Creatinine, Ser: 0.9 mg/dL (ref 0.61–1.24)
GFR calc Af Amer: >60 mL/min (ref >60)
GFR calc non Af Amer: >60 mL/min (ref >60)
Glucose, Bld: 160 mg/dL — ABNORMAL HIGH (ref 70–99)
Potassium: 4.9 mmol/L (ref 3.5–5.1)
Sodium: 134 mmol/L — ABNORMAL LOW (ref 135–145)

## 2018-11-12 LAB — PHOSPHORUS: Phosphorus: 2.9 mg/dL (ref 2.5–4.6)

## 2018-11-12 LAB — GLUCOSE, CAPILLARY
GLUCOSE-CAPILLARY: 222 mg/dL — AB (ref 70–99)
Glucose-Capillary: 156 mg/dL — ABNORMAL HIGH (ref 70–99)

## 2018-11-12 LAB — MAGNESIUM: Magnesium: 2.1 mg/dL (ref 1.7–2.4)

## 2018-11-12 MED ORDER — SODIUM CHLORIDE 0.9 % IV SOLN
2.0000 g | INTRAVENOUS | Status: DC
  Administered 2018-11-12: 2 g via INTRAVENOUS
  Filled 2018-11-12: qty 2
  Filled 2018-11-12: qty 20

## 2018-11-12 MED ORDER — METRONIDAZOLE 500 MG PO TABS: 500.0000 mg | ORAL_TABLET | Freq: Three times a day (TID) | ORAL | 0 refills | Status: DC

## 2018-11-12 MED ORDER — CEFTRIAXONE IV (FOR PTA / DISCHARGE USE ONLY): 2.0000 g | INTRAVENOUS | 0 refills | Status: DC

## 2018-11-12 MED ORDER — HYDROCODONE-ACETAMINOPHEN 5-325 MG PO TABS: 1.0000 | ORAL_TABLET | Freq: Four times a day (QID) | ORAL | 0 refills | Status: DC | PRN

## 2018-11-12 MED ORDER — GABAPENTIN 100 MG PO CAPS: 100.0000 mg | ORAL_CAPSULE | Freq: Every day | ORAL | 0 refills | Status: DC

## 2018-11-12 MED ORDER — CELECOXIB 200 MG PO CAPS: 200.0000 mg | ORAL_CAPSULE | Freq: Every day | ORAL | 0 refills | Status: DC

## 2018-11-12 NOTE — Discharge Instructions (Signed)
Diverticulitis  Diverticulitis is infection or inflammation of small pouches (diverticula) in the colon that form due to a condition called diverticulosis. Diverticula can trap stool (feces) and bacteria, causing infection and inflammation. Diverticulitis may cause severe stomach pain and diarrhea. It may lead to tissue damage in the colon that causes bleeding. The diverticula may also burst (rupture) and cause infected stool to enter other areas of the abdomen. Complications of diverticulitis can include:  Bleeding.  Severe infection.  Severe pain.  Rupture (perforation) of the colon.  Blockage (obstruction) of the colon. What are the causes? This condition is caused by stool becoming trapped in the diverticula, which allows bacteria to grow in the diverticula. This leads to inflammation and infection. What increases the risk? You are more likely to develop this condition if:  You have diverticulosis. The risk for diverticulosis increases if: ? You are overweight or obese. ? You use tobacco products. ? You do not get enough exercise.  You eat a diet that does not include enough fiber. High-fiber foods include fruits, vegetables, beans, nuts, and whole grains. What are the signs or symptoms? Symptoms of this condition may include:  Pain and tenderness in the abdomen. The pain is normally located on the left side of the abdomen, but it may occur in other areas.  Fever and chills.  Bloating.  Cramping.  Nausea.  Vomiting.  Changes in bowel routines.  Blood in your stool. How is this diagnosed? This condition is diagnosed based on:  Your medical history.  A physical exam.  Tests to make sure there is nothing else causing your condition. These tests may include: ? Blood tests. ? Urine tests. ? Imaging tests of the abdomen, including X-rays, ultrasounds, MRIs, or CT scans. How is this treated? Most cases of this condition are mild and can be treated at home.  Treatment may include:  Taking over-the-counter pain medicines.  Following a clear liquid diet.  Taking antibiotic medicines by mouth.  Rest. More severe cases may need to be treated at a hospital. Treatment may include:  Not eating or drinking.  Taking prescription pain medicine.  Receiving antibiotic medicines through an IV tube.  Receiving fluids and nutrition through an IV tube.  Surgery. When your condition is under control, your health care provider may recommend that you have a colonoscopy. This is an exam to look at the entire large intestine. During the exam, a lubricated, bendable tube is inserted into the anus and then passed into the rectum, colon, and other parts of the large intestine. A colonoscopy can show how severe your diverticula are and whether something else may be causing your symptoms. Follow these instructions at home: Medicines  Take over-the-counter and prescription medicines only as told by your health care provider. These include fiber supplements, probiotics, and stool softeners.  If you were prescribed an antibiotic medicine, take it as told by your health care provider. Do not stop taking the antibiotic even if you start to feel better.  Do not drive or use heavy machinery while taking prescription pain medicine. General instructions   Follow a full liquid diet or another diet as directed by your health care provider. After your symptoms improve, your health care provider may tell you to change your diet. He or she may recommend that you eat a diet that contains at least 25 g (25 grams) of fiber daily. Fiber makes it easier to pass stool. Healthy sources of fiber include: ? Berries. One cup contains 4-8 grams of  fiber. ? Beans or lentils. One half cup contains 5-8 grams of fiber. ? Green vegetables. One cup contains 4 grams of fiber.  Exercise for at least 30 minutes, 3 times each week. You should exercise hard enough to raise your heart rate and  break a sweat.  Keep all follow-up visits as told by your health care provider. This is important. You may need a colonoscopy. Contact a health care provider if:  Your pain does not improve.  You have a hard time drinking or eating food.  Your bowel movements do not return to normal. Get help right away if:  Your pain gets worse.  Your symptoms do not get better with treatment.  Your symptoms suddenly get worse.  You have a fever.  You vomit more than one time.  You have stools that are bloody, black, or tarry. Summary  Diverticulitis is infection or inflammation of small pouches (diverticula) in the colon that form due to a condition called diverticulosis. Diverticula can trap stool (feces) and bacteria, causing infection and inflammation.  You are at higher risk for this condition if you have diverticulosis and you eat a diet that does not include enough fiber.  Most cases of this condition are mild and can be treated at home. More severe cases may need to be treated at a hospital.  When your condition is under control, your health care provider may recommend that you have an exam called a colonoscopy. This exam can show how severe your diverticula are and whether something else may be causing your symptoms. This information is not intended to replace advice given to you by your health care provider. Make sure you discuss any questions you have with your health care provider. Document Released: 06/11/2005 Document Revised: 10/04/2016 Document Reviewed: 10/04/2016 Elsevier Interactive Patient Education  2019 [employer]   Diverticulitis  Diverticulitis is when small pockets in your large intestine (colon) get infected or swollen. This causes stomach pain and watery poop (diarrhea). These pouches are called diverticula. They form in people who have a condition called diverticulosis. Follow these instructions at home: Medicines  Take over-the-counter and prescription  medicines only as told by your doctor. These include: ? Antibiotics. ? Pain medicines. ? Fiber pills. ? Probiotics. ? Stool softeners.  Do not drive or use heavy machinery while taking prescription pain medicine.  If you were prescribed an antibiotic, take it as told. Do not stop taking it even if you feel better. General instructions   Follow a diet as told by your doctor.  When you feel better, your doctor may tell you to change your diet. You may need to eat a lot of fiber. Fiber makes it easier to poop (have bowel movements). Healthy foods with fiber include: ? Berries. ? Beans. ? Lentils. ? Green vegetables.  Exercise 3 or more times a week. Aim for 30 minutes each time. Exercise enough to sweat and make your heart beat faster.  Keep all follow-up visits as told. This is important. You may need to have an exam of the large intestine. This is called a colonoscopy. Contact a doctor if:  Your pain does not get better.  You have a hard time eating or drinking.  You are not pooping like normal. Get help right away if:  Your pain gets worse.  Your problems do not get better.  Your problems get worse very fast.  You have a fever.  You throw up (vomit) more than one time.  You have poop  that is: ? Bloody. ? Black. ? Tarry. Summary  Diverticulitis is when small pockets in your large intestine (colon) get infected or swollen.  Take medicines only as told by your doctor.  Follow a diet as told by your doctor. This information is not intended to replace advice given to you by your health care provider. Make sure you discuss any questions you have with your health care provider. Document Released: 02/18/2008 Document Revised: 09/18/2016 Document Reviewed: 09/18/2016 Elsevier Interactive Patient Education  2019 [employer].

## 2018-11-12 NOTE — Progress Notes (Signed)
Called report to Gavin Pound at [facility] [phone].   Madie Reno, RN

## 2018-11-12 NOTE — Discharge Summary (Signed)
Physician Discharge Summary  Patient ID: Benjamin Parks MRN: [id number] DOB/AGE: 02/04/1919 85 y.o.  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  Initial treatment options discussed including surgery but due to patient's age and comorbidities attempt was made to medically manage with IR placement of a drain at the site.  Successful placement of the IR drain with feculent material noted.  IV antibiotics were started in the meantime and patient was monitored through serial abdominal exams and lab work.  Overall patient's pain and lab work slowly improved to the point where he was able to tolerate regular diet with minimal discomfort.  The drain continues to have an average of 30 mL's of feculent material, which is expected from the size of the fistula noted on CT.  Daily discussions were had as far as long-term outcomes and treatment options, eluding interval surgery.  Final decision between patient and myself was that we will hold off on surgery as long as possible because at this time risks are too high for possible marginal improvement in his current symptoms with the drain in place.    Due to his persistent diverticulitis, infectious disease was consulted IV antibiotics management.  Final recommendation was to finish course of IV antibiotics with p.o. antibiotics due to the susceptibility noted from the drain culture.  IV PICC line placed and arrangements made for the patient to be discharged to [facility] where he can continue to receive drain care and IV antibiotic infusion through the recommended duration.  Repeat CT scans may be ordered in the future depending  on any changes in the drain amount as well as drain characteristic.  At the time of discharge patient and son was comfortable with the current plan and will have close follow-up with surgery and infectious disease.  He is okay to resume all home medications including his Plavix and aspirin while the drain is in place.   Consults: ID  Discharge Exam: Blood pressure 140/62, pulse 65, temperature 98.4 F (36.9 C), temperature source Oral, resp. rate 20, height 5\' 10"  (1.778 m), weight 77 kg, SpO2 95 %. General appearance: alert, cooperative and no distress GI: soft, no guarding, but still has some tenderness along drain site and in suprapubic region, overall much improved and tolerable.  Disposition:  Discharge disposition: 01-Home or Self Care       Discharge Instructions    Home infusion instructions   Complete by:  As directed    Instructions:  Flushing of vascular access device: 0.9% NaCl pre/post medication administration and prn patency; Heparin 100 u/ml, 57ml for implanted ports and Heparin 10u/ml, 9ml for all other central venous catheters.     Allergies as of 11/12/2018      Reactions   Azithromycin    Erythromycin Base    Other reaction(s): UNKNOWN   Metformin And Related Other (See Comments)   unknown      Medication List    STOP taking these medications   amoxicillin-clavulanate 875-125 MG tablet Commonly known as:  AUGMENTIN     TAKE these medications   accu-chek multiclix lancets Use as instructed   ARTIFICIAL TEARS 0.1-0.3 % Soln Generic drug:  Dextran 70-Hypromellose Apply to eye.   aspirin 81 MG EC tablet  Take 1 tablet (81 mg total) by mouth daily.   atorvastatin 80 MG tablet Commonly known as:  LIPITOR Take 0.5 tablets (40 mg total) by mouth daily at 6 PM.   cefTRIAXone  IVPB Commonly known as:  ROCEPHIN Inject 2 g into the vein daily for 7 days. Indication: diverticular abscess Last Day of Therapy:  11/18/2018 Labs - Once weekly:  CBC/D and  BMP,   celecoxib 200 MG capsule Commonly known as:  CELEBREX Take 1 capsule (200 mg total) by mouth daily.   clopidogrel 75 MG tablet Commonly known as:  PLAVIX Take 1 tablet (75 mg total) by mouth daily.   cyanocobalamin 1000 MCG/ML injection Commonly known as:  (VITAMIN B-12) Inject 1,000 mcg into the muscle every 30 (thirty) days.   diclofenac sodium 1 % Gel Commonly known as:  VOLTAREN Apply 2 g topically 2 (two) times daily as needed for pain.   finasteride 5 MG tablet Commonly known as:  PROSCAR Take 5 mg by mouth daily.   gabapentin 100 MG capsule Commonly known as:  NEURONTIN Take 1 capsule (100 mg total) by mouth daily.   glipiZIDE 5 MG tablet Commonly known as:  GLUCOTROL Take 0.5 tablets (2.5 mg total) by mouth daily before breakfast.   HYDROcodone-acetaminophen 5-325 MG tablet Commonly known as:  NORCO/VICODIN Take 1 tablet by mouth every 6 (six) hours as needed for moderate pain.   lisinopril 2.5 MG tablet Commonly known as:  PRINIVIL,ZESTRIL Take 1 tablet (2.5 mg total) by mouth daily.   metoprolol tartrate 25 MG tablet Commonly known as:  LOPRESSOR Take 1 tablet (25 mg total) by mouth 2 (two) times daily. What changed:  when to take this   metroNIDAZOLE 500 MG tablet Commonly known as:  FLAGYL Take 1 tablet (500 mg total) by mouth 3 (three) times daily for 7 days.   nitroGLYCERIN 0.4 MG SL tablet Commonly known as:  NITROSTAT Place 1 tablet (0.4 mg total) under the tongue every 5 (five) minutes x 3 doses as needed for chest pain.   PRESERVISION AREDS Caps Take 1 capsule by mouth daily.   triamcinolone cream 0.1 % Commonly known as:  KENALOG APPLY TO AFFECTED AREA TWICE A DAY            Home Infusion Instuctions  (From admission, onward)         Start     Ordered   11/12/18 0000  Home infusion instructions    Question:  Instructions  Answer:  Flushing of vascular access device: 0.9% NaCl pre/post medication administration and prn  patency; Heparin 100 u/ml, 10ml for implanted ports and Heparin 10u/ml, 38ml for all other central venous catheters.   11/12/18 5462          Contact information for follow-up providers    Tonna Boehringer, Finnigan Warriner, DO. Go on 11/26/2018.   Specialty:  Surgery Why:  @10 :00 am Contact information: [address] [place] [address] [phone]            Contact information for after-discharge care    Destination    [facility] .   Service:  Skilled Nursing Contact information: [address] [place] [address] [phone]                   Total time spent arranging discharge was >18min. Signed: Sung Amabile 11/12/2018, 9:48 AM

## 2018-11-12 NOTE — Progress Notes (Signed)
Benjamin Parks to be D/C'd [facility] per MD order.  Discussed prescriptions and follow up appointments with the patient. Prescriptions given to patient, medication list explained in detail. Pt verbalized understanding.  Allergies as of 11/12/2018      Reactions   Azithromycin    Erythromycin Base    Other reaction(s): UNKNOWN   Metformin And Related Other (See Comments)   unknown      Medication List    STOP taking these medications   amoxicillin-clavulanate 875-125 MG tablet Commonly known as:  AUGMENTIN     TAKE these medications   accu-chek multiclix lancets Use as instructed   ARTIFICIAL TEARS 0.1-0.3 % Soln Generic drug:  Dextran 70-Hypromellose Apply to eye.   aspirin 81 MG EC tablet Take 1 tablet (81 mg total) by mouth daily.   atorvastatin 80 MG tablet Commonly known as:  LIPITOR Take 0.5 tablets (40 mg total) by mouth daily at 6 PM.   cefTRIAXone  IVPB Commonly known as:  ROCEPHIN Inject 2 g into the vein daily for 7 days. Indication: diverticular abscess Last Day of Therapy:  11/18/2018 Labs - Once weekly:  CBC/D and BMP,   celecoxib 200 MG capsule Commonly known as:  CELEBREX Take 1 capsule (200 mg total) by mouth daily.   clopidogrel 75 MG tablet Commonly known as:  PLAVIX Take 1 tablet (75 mg total) by mouth daily.   cyanocobalamin 1000 MCG/ML injection Commonly known as:  (VITAMIN B-12) Inject 1,000 mcg into the muscle every 30 (thirty) days.   diclofenac sodium 1 % Gel Commonly known as:  VOLTAREN Apply 2 g topically 2 (two) times daily as needed for pain.   finasteride 5 MG tablet Commonly known as:  PROSCAR Take 5 mg by mouth daily.   gabapentin 100 MG capsule Commonly known as:  NEURONTIN Take 1 capsule (100 mg total) by mouth daily.   glipiZIDE 5 MG tablet Commonly known as:  GLUCOTROL Take 0.5 tablets (2.5 mg total) by mouth daily before breakfast.   HYDROcodone-acetaminophen 5-325 MG tablet Commonly known  as:  NORCO/VICODIN Take 1 tablet by mouth every 6 (six) hours as needed for moderate pain.   lisinopril 2.5 MG tablet Commonly known as:  PRINIVIL,ZESTRIL Take 1 tablet (2.5 mg total) by mouth daily.   metoprolol tartrate 25 MG tablet Commonly known as:  LOPRESSOR Take 1 tablet (25 mg total) by mouth 2 (two) times daily. What changed:  when to take this   metroNIDAZOLE 500 MG tablet Commonly known as:  FLAGYL Take 1 tablet (500 mg total) by mouth 3 (three) times daily for 7 days.   nitroGLYCERIN 0.4 MG SL tablet Commonly known as:  NITROSTAT Place 1 tablet (0.4 mg total) under the tongue every 5 (five) minutes x 3 doses as needed for chest pain.   PRESERVISION AREDS Caps Take 1 capsule by mouth daily.   triamcinolone cream 0.1 % Commonly known as:  KENALOG APPLY TO AFFECTED AREA TWICE A DAY            Home Infusion Instuctions  (From admission, onward)         Start     Ordered   11/12/18 0000  Home infusion instructions    Question:  Instructions  Answer:  Flushing of vascular access device: 0.9% NaCl pre/post medication administration and prn patency; Heparin 100 u/ml, 64ml for implanted ports and Heparin 10u/ml, 43ml for all other central venous catheters.   11/12/18 7322  Vitals:   11/12/18 0550 11/12/18 1006  BP: 140/62 131/66  Pulse: 65 89  Resp: 20   Temp: 98.4 F (36.9 C)   SpO2: 95%     Skin clean, dry and intact without evidence of skin break down, no evidence of skin tears noted. IV catheter discontinued intact. Site without signs and symptoms of complications. Dressing and pressure applied. Pt denies pain at this time. No complaints noted.  An After Visit Summary was printed and given to the patient. Patient escorted via WC, and D/C SNF via private auto.  Madie Reno, RN

## 2018-11-12 NOTE — Progress Notes (Signed)
Transportation with [facility] is 15 minutes away.   Madie Reno, RN

## 2018-11-15 DIAGNOSIS — K5720 Diverticulitis of large intestine with perforation and abscess without bleeding: Secondary | ICD-10-CM | POA: Diagnosis not present

## 2018-11-16 ENCOUNTER — Other Ambulatory Visit: Payer: Self-pay

## 2018-11-16 ENCOUNTER — Emergency Department: Payer: Medicare Other

## 2018-11-16 ENCOUNTER — Inpatient Hospital Stay
Admission: EM | Admit: 2018-11-16 | Discharge: 2018-11-18 | DRG: 392 | Disposition: A | Discharge status: Hospice | Payer: Medicare Other | Attending: Surgery | Admitting: Surgery

## 2018-11-16 DIAGNOSIS — Z8673 Personal history of transient ischemic attack (TIA), and cerebral infarction without residual deficits: Secondary | ICD-10-CM | POA: Diagnosis not present

## 2018-11-16 DIAGNOSIS — Z8249 Family history of ischemic heart disease and other diseases of the circulatory system: Secondary | ICD-10-CM

## 2018-11-16 DIAGNOSIS — E785 Hyperlipidemia, unspecified: Secondary | ICD-10-CM | POA: Diagnosis present

## 2018-11-16 DIAGNOSIS — I959 Hypotension, unspecified: Secondary | ICD-10-CM | POA: Diagnosis present

## 2018-11-16 DIAGNOSIS — R1032 Left lower quadrant pain: Secondary | ICD-10-CM | POA: Diagnosis not present

## 2018-11-16 DIAGNOSIS — Z7189 Other specified counseling: Secondary | ICD-10-CM

## 2018-11-16 DIAGNOSIS — I10 Essential (primary) hypertension: Secondary | ICD-10-CM | POA: Diagnosis present

## 2018-11-16 DIAGNOSIS — Z7984 Long term (current) use of oral hypoglycemic drugs: Secondary | ICD-10-CM

## 2018-11-16 DIAGNOSIS — Z7952 Long term (current) use of systemic steroids: Secondary | ICD-10-CM

## 2018-11-16 DIAGNOSIS — Z66 Do not resuscitate: Secondary | ICD-10-CM | POA: Diagnosis present

## 2018-11-16 DIAGNOSIS — Z7982 Long term (current) use of aspirin: Secondary | ICD-10-CM

## 2018-11-16 DIAGNOSIS — Z87891 Personal history of nicotine dependence: Secondary | ICD-10-CM | POA: Diagnosis not present

## 2018-11-16 DIAGNOSIS — Z791 Long term (current) use of non-steroidal anti-inflammatories (NSAID): Secondary | ICD-10-CM | POA: Diagnosis not present

## 2018-11-16 DIAGNOSIS — N179 Acute kidney failure, unspecified: Secondary | ICD-10-CM

## 2018-11-16 DIAGNOSIS — Z79899 Other long term (current) drug therapy: Secondary | ICD-10-CM

## 2018-11-16 DIAGNOSIS — I252 Old myocardial infarction: Secondary | ICD-10-CM | POA: Diagnosis not present

## 2018-11-16 DIAGNOSIS — Z515 Encounter for palliative care: Secondary | ICD-10-CM | POA: Diagnosis present

## 2018-11-16 DIAGNOSIS — K5732 Diverticulitis of large intestine without perforation or abscess without bleeding: Secondary | ICD-10-CM | POA: Diagnosis present

## 2018-11-16 DIAGNOSIS — Z7902 Long term (current) use of antithrombotics/antiplatelets: Secondary | ICD-10-CM

## 2018-11-16 DIAGNOSIS — K5720 Diverticulitis of large intestine with perforation and abscess without bleeding: Secondary | ICD-10-CM | POA: Diagnosis not present

## 2018-11-16 DIAGNOSIS — I2510 Atherosclerotic heart disease of native coronary artery without angina pectoris: Secondary | ICD-10-CM | POA: Diagnosis present

## 2018-11-16 DIAGNOSIS — E119 Type 2 diabetes mellitus without complications: Secondary | ICD-10-CM | POA: Diagnosis present

## 2018-11-16 LAB — COMPREHENSIVE METABOLIC PANEL
ALT: 19 U/L (ref 0–44)
AST: 19 U/L (ref 15–41)
Albumin: 2.8 g/dL — ABNORMAL LOW (ref 3.5–5.0)
Alkaline Phosphatase: 95 U/L (ref 38–126)
Anion gap: 9 (ref 5–15)
BUN: 50 mg/dL — ABNORMAL HIGH (ref 8–23)
CO2: 21 mmol/L — AB (ref 22–32)
Calcium: 7.8 mg/dL — ABNORMAL LOW (ref 8.9–10.3)
Chloride: 100 mmol/L (ref 98–111)
Creatinine, Ser: 1.9 mg/dL — ABNORMAL HIGH (ref 0.61–1.24)
GFR calc non Af Amer: 29 mL/min — ABNORMAL LOW (ref >60)
GFR, EST AFRICAN AMERICAN: 33 mL/min — AB (ref >60)
Glucose, Bld: 245 mg/dL — ABNORMAL HIGH (ref 70–99)
Potassium: 5.4 mmol/L — ABNORMAL HIGH (ref 3.5–5.1)
SODIUM: 130 mmol/L — AB (ref 135–145)
Total Bilirubin: 0.6 mg/dL (ref 0.3–1.2)
Total Protein: 6.3 g/dL — ABNORMAL LOW (ref 6.5–8.1)

## 2018-11-16 LAB — CBC WITH DIFFERENTIAL/PLATELET
ABS IMMATURE GRANULOCYTES: 0.36 10*3/uL — AB (ref 0.00–0.07)
Basophils Absolute: 0.1 10*3/uL (ref 0.0–0.1)
Basophils Relative: 0 %
Eosinophils Absolute: 0 10*3/uL (ref 0.0–0.5)
Eosinophils Relative: 0 %
HCT: 37.3 % — ABNORMAL LOW (ref 39.0–52.0)
Hemoglobin: 12 g/dL — ABNORMAL LOW (ref 13.0–17.0)
Immature Granulocytes: 2 %
Lymphocytes Relative: 2 %
Lymphs Abs: 0.5 10*3/uL — ABNORMAL LOW (ref 0.7–4.0)
MCH: 30.3 pg (ref 26.0–34.0)
MCHC: 32.2 g/dL (ref 30.0–36.0)
MCV: 94.2 fL (ref 80.0–100.0)
Monocytes Absolute: 1.6 10*3/uL — ABNORMAL HIGH (ref 0.1–1.0)
Monocytes Relative: 7 %
NEUTROS ABS: 20.5 10*3/uL — AB (ref 1.7–7.7)
Neutrophils Relative %: 89 %
Platelets: 265 10*3/uL (ref 150–400)
RBC: 3.96 MIL/uL — ABNORMAL LOW (ref 4.22–5.81)
RDW: 13.2 % (ref 11.5–15.5)
WBC: 23 10*3/uL — ABNORMAL HIGH (ref 4.0–10.5)
nRBC: 0 % (ref 0.0–0.2)

## 2018-11-16 LAB — TYPE AND SCREEN
ABO/RH(D): AB NEG
Antibody Screen: NEGATIVE

## 2018-11-16 MED ORDER — SODIUM CHLORIDE 0.9 % IV BOLUS
250.0000 mL | Freq: Once | INTRAVENOUS | Status: AC
  Administered 2018-11-16: 250 mL via INTRAVENOUS

## 2018-11-16 MED ORDER — SODIUM CHLORIDE 0.9 % IV SOLN
Freq: Once | INTRAVENOUS | Status: AC
  Administered 2018-11-16: 17:00:00 via INTRAVENOUS

## 2018-11-16 NOTE — ED Provider Notes (Signed)
[facility] Emergency Department Provider Note    None    (approximate)  I have reviewed the triage vital signs and the nursing notes.   HISTORY  Chief Complaint Hypotension    HPI Benjamin Parks is a 85 y.o. male listed past medical history status post recent admission the hospital for perforated diverticulitis with left lower quadrant drain in place 3 presents the ER for weakness.  States he is not been eating much for the past several days.  Denies any worsening pain.  Does appear slightly confused but according to EMS and family member patient took a Percocet for the first time roughly 1 hour prior to arrival.  Denies any chest pain or shortness of breath.    Past Medical History:  Diagnosis Date  . Diabetes mellitus without complication (HCC)   . History of heart attack   . Hyperlipidemia   . Hypertension   . PVC (premature ventricular contraction)   . Right bundle branch block   . Stroke [facility])    Family History  Problem Relation Age of Onset  . Heart attack Mother        deceased  . Heart attack Father        deceased   Past Surgical History:  Procedure Laterality Date  . APPENDECTOMY    . CARDIAC CATHETERIZATION N/A 11/08/2015   Procedure: Left Heart Cath and Coronary Angiography;  Surgeon: Dalia Heading, MD;  Location: [facility] INVASIVE CV LAB;  Service: Cardiovascular;  Laterality: N/A;  . HERNIA REPAIR    . none    . TONSILLECTOMY     Patient Active Problem List   Diagnosis Date Noted  . Malnutrition of moderate degree 11/11/2018  . Diverticulitis large intestine 11/04/2018  . Sigmoid diverticulitis 10/30/2018  . SOBOE (shortness of breath on exertion) 06/17/2018  . BPH with obstruction/lower urinary tract symptoms 01/01/2017  . Hematuria 01/01/2017  . Benign essential HTN 11/16/2015  . CAD (coronary artery disease) 11/16/2015  . Mixed hyperlipidemia 11/16/2015  . Non-STEMI (non-ST elevated myocardial infarction) (HCC)  11/07/2015  . Community acquired pneumonia 11/07/2015  . NSTEMI (non-ST elevated myocardial infarction) (HCC) 11/07/2015  . Chronic cystitis 08/17/2013  . Elevated PSA 08/17/2013  . Gross hematuria 08/16/2012  . Incomplete emptying of bladder 08/16/2012  . Nodular prostate with urinary obstruction 08/16/2012  . Retention of urine 08/16/2012  . Urinary tract infection 08/16/2012      Prior to Admission medications   Medication Sig Start Date End Date Taking? Authorizing Provider  aspirin EC 81 MG EC tablet Take 1 tablet (81 mg total) by mouth daily. 11/09/15   Adrian Saran, MD  atorvastatin (LIPITOR) 80 MG tablet Take 0.5 tablets (40 mg total) by mouth daily at 6 PM. 11/09/15   Adrian Saran, MD  cefTRIAXone (ROCEPHIN) IVPB Inject 2 g into the vein daily for 7 days. Indication: diverticular abscess Last Day of Therapy:  11/18/2018 Labs - Once weekly:  CBC/D and BMP, 11/12/18 11/19/18  Sung Amabile, DO  celecoxib (CELEBREX) 200 MG capsule Take 1 capsule (200 mg total) by mouth daily. 11/12/18 11/12/19  Sung Amabile, DO  clopidogrel (PLAVIX) 75 MG tablet Take 1 tablet (75 mg total) by mouth daily. 11/09/15   Adrian Saran, MD  cyanocobalamin (,VITAMIN B-12,) 1000 MCG/ML injection Inject 1,000 mcg into the muscle every 30 (thirty) days.    [provider]  Dextran 70-Hypromellose (ARTIFICIAL TEARS) 0.1-0.3 % SOLN Apply to eye.    [provider]  diclofenac sodium (  VOLTAREN) 1 % GEL Apply 2 g topically 2 (two) times daily as needed for pain. 01/13/17   [provider]  finasteride (PROSCAR) 5 MG tablet Take 5 mg by mouth daily. 02/19/17   [provider]  gabapentin (NEURONTIN) 100 MG capsule Take 1 capsule (100 mg total) by mouth daily. 11/12/18 11/12/19  Tonna Boehringer, Isami, DO  glipiZIDE (GLUCOTROL) 5 MG tablet Take 0.5 tablets (2.5 mg total) by mouth daily before breakfast. 11/09/15   Adrian Saran, MD  HYDROcodone-acetaminophen (NORCO/VICODIN) 5-325 MG tablet Take 1 tablet by mouth  every 6 (six) hours as needed for moderate pain. 11/12/18   Sung Amabile, DO  Lancets (ACCU-CHEK MULTICLIX) lancets Use as instructed 11/09/15   Adrian Saran, MD  lisinopril (PRINIVIL,ZESTRIL) 2.5 MG tablet Take 1 tablet (2.5 mg total) by mouth daily. 11/09/15   Adrian Saran, MD  metoprolol tartrate (LOPRESSOR) 25 MG tablet Take 1 tablet (25 mg total) by mouth 2 (two) times daily. Patient taking differently: Take 25 mg by mouth daily.  11/09/15   Adrian Saran, MD  metroNIDAZOLE (FLAGYL) 500 MG tablet Take 1 tablet (500 mg total) by mouth 3 (three) times daily for 7 days. 11/12/18 11/19/18  Sung Amabile, DO  Multiple Vitamins-Minerals (PRESERVISION AREDS) CAPS Take 1 capsule by mouth daily.  08/16/12   [provider]  nitroGLYCERIN (NITROSTAT) 0.4 MG SL tablet Place 1 tablet (0.4 mg total) under the tongue every 5 (five) minutes x 3 doses as needed for chest pain. 11/09/15   Adrian Saran, MD  triamcinolone cream (KENALOG) 0.1 % APPLY TO AFFECTED AREA TWICE A DAY 07/27/18   [provider]    Allergies Azithromycin; Erythromycin base; and Metformin and related    Social History Social History   Tobacco Use  . Smoking status: Former Games developer  . Smokeless tobacco: Never Used  Substance Use Topics  . Alcohol use: Yes    Alcohol/week: 0.0 standard drinks    Comment: one cocktail every day.  . Drug use: No    Review of Systems Patient denies headaches, rhinorrhea, blurry vision, numbness, shortness of breath, chest pain, edema, cough, abdominal pain, nausea, vomiting, diarrhea, dysuria, fevers, rashes or hallucinations unless otherwise stated above in HPI. ____________________________________________   PHYSICAL EXAM:  VITAL SIGNS: Vitals:   11/16/18 1539  BP: (!) 92/32  Pulse: (!) 59  Resp: 18  Temp: (!) 97.5 F (36.4 C)  SpO2: 97%    Constitutional: Alert, drowsy, frail and elderly appearing  Eyes: Conjunctivae are normal.  Head: Atraumatic. Nose: No  congestion/rhinnorhea. Mouth/Throat: Mucous membranes are moist.   Neck: No stridor. Painless ROM.  Cardiovascular: Normal rate, regular rhythm. Grossly normal heart sounds.  Good peripheral circulation. Respiratory: Normal respiratory effort.  No retractions. Lungs CTAB. Gastrointestinal: Soft and nontender. No distention. No abdominal bruits. LLQ drain in place with purulent drainage Genitourinary:  Musculoskeletal: No lower extremity tenderness nor edema.  No joint effusions. Neurologic:  Normal speech and language. No gross focal neurologic deficits are appreciated. No facial droop Skin:  Skin is warm, dry and intact. No rash noted. Psychiatric: Mood and affect are normal. Speech and behavior are normal.  ____________________________________________   LABS (all labs ordered are listed, but only abnormal results are displayed)  No results found for this or any previous visit (from the past 24 hour(s)). ____________________________________________  EKG My review and personal interpretation at Time: 15:46 Indication: weakness  Rate: 115  Rhythm: sinus Axis: right Other: normal iontervals, no stemi, rbbb ____________________________________________  RADIOLOGY  I  personally reviewed all radiographic images ordered to evaluate for the above acute complaints and reviewed radiology reports and findings.  These findings were personally discussed with the patient.  Please see medical record for radiology report.  ____________________________________________   PROCEDURES  Procedure(s) performed:  Procedures    Critical Care performed: no ____________________________________________   INITIAL IMPRESSION / ASSESSMENT AND PLAN / ED COURSE  Pertinent labs & imaging results that were available during my care of the patient were reviewed by me and considered in my medical decision making (see chart for details).   DDX: dehydration, diverticulitis, abscess, bleed,   Benjamin Parks  is a 85 y.o. who presents to the ED with symptoms as described above presenting from outpatient surgery clinic.  Patient very frail appearing hypotensive and dehydrated.  Will give IV fluids.  Blood work sent for the but differential does show marked leukocytosis.  Will initiate IV antibiotics and continue with IV resuscitation.  Will order CT imaging and discuss with general surgery.  Clinical Course as of Nov 15 1925  Tue Nov 16, 2018  1643 Blood work does show evidence of AKI.   [PR]  1807 Discussed results of blood work and CT imaging with family at bedside.  It does sound that the patient has interested in pursuing comfort measures and is aware of the severity of his current disease process.  I have consulted Dr. Tonna Boehringer of general surgery who kindly agrees to evaluate patient at bedside.   [PR]    Clinical Course User Index [PR] Willy Eddy, MD     As part of my medical decision making, I reviewed the following data within the electronic MEDICAL RECORD NUMBER Nursing notes reviewed and incorporated, Labs reviewed, notes from prior ED visits and [place] Controlled Substance Database   ____________________________________________   FINAL CLINICAL IMPRESSION(S) / ED DIAGNOSES  Final diagnoses:  AKI (acute kidney injury) (HCC)  Left lower quadrant abdominal pain      NEW MEDICATIONS STARTED DURING THIS VISIT:  New Prescriptions   No medications on file     Note:  This document was prepared using Dragon voice recognition software and may include unintentional dictation errors.    Willy Eddy, MD 11/16/18 Serena Croissant

## 2018-11-16 NOTE — ED Notes (Addendum)
ED TO INPATIENT HANDOFF REPORT  ED Nurse Name and Phone #:  Terance Hart, RN  [phone]  S Name/Age/Gender Benjamin Parks 85 y.o. male Room/Bed: ED34A/ED34A  Code Status   Code Status: Prior  Home/SNF/Other SNF- [facility] Patient oriented to: self, place, time and situation Is this baseline? Yes   Triage Complete: Triage complete  Chief Complaint htn and abd pain  Triage Note From [facility], c/o hypotension. Pt had recent abdominal surgery due to diverticulitis. Pt pale, appears fatigued.  Denies CP or SOB.     Allergies Allergies  Allergen Reactions  . Azithromycin   . Erythromycin Base     Other reaction(s): UNKNOWN  . Metformin And Related Other (See Comments)    unknown    Level of Care/Admitting Diagnosis ED Disposition    ED Disposition Condition Comment   Admit  Hospital Area: [facility] [[id number]]  Level of Care: Med-Surg [16]  Diagnosis: Diverticulitis large intestine [035597]  Admitting Physician: Sung Amabile [[id number]]  Attending Physician: Sung Amabile [[id number]]  Estimated length of stay: 5 - 7 days  Certification:: I certify this patient will need inpatient services for at least 2 midnights  PT Class (Do Not Modify): Inpatient [101]  PT Acc Code (Do Not Modify): Private [1]       B Medical/Surgery History Past Medical History:  Diagnosis Date  . Diabetes mellitus without complication (HCC)   . History of heart attack   . Hyperlipidemia   . Hypertension   . PVC (premature ventricular contraction)   . Right bundle branch block   . Stroke [facility])    Past Surgical History:  Procedure Laterality Date  . APPENDECTOMY    . CARDIAC CATHETERIZATION N/A 11/08/2015   Procedure: Left Heart Cath and Coronary Angiography;  Surgeon: Dalia Heading, MD;  Location: [facility] INVASIVE CV LAB;  Service: Cardiovascular;  Laterality: N/A;  . HERNIA REPAIR    . none    . TONSILLECTOMY       A IV Location/Drains/Wounds Patient  Lines/Drains/Airways Status   Active Line/Drains/Airways    Name:   Placement date:   Placement time:   Site:   Days:   PICC Single Lumen 11/11/18 PICC Right Basilic 40 cm 0 cm   11/11/18    1318    Basilic   5   Closed System Drain Lateral;Left Abdomen Bulb (JP)   11/05/18    -    Abdomen   11          Intake/Output Last 24 hours  Intake/Output Summary (Last 24 hours) at 11/16/2018 2231 Last data filed at 11/16/2018 2013 Gross per 24 hour  Intake 578.42 ml  Output -  Net 578.42 ml    Labs/Imaging Results for orders placed or performed during the hospital encounter of 11/16/18 (from the past 48 hour(s))  CBC with Differential/Platelet     Status: Abnormal   Collection Time: 11/16/18  4:04 PM  Result Value Ref Range   WBC 23.0 (H) 4.0 - 10.5 K/uL   RBC 3.96 (L) 4.22 - 5.81 MIL/uL   Hemoglobin 12.0 (L) 13.0 - 17.0 g/dL   HCT 21.2 (L) 24.8 - 25.0 %   MCV 94.2 80.0 - 100.0 fL   MCH 30.3 26.0 - 34.0 pg   MCHC 32.2 30.0 - 36.0 g/dL   RDW 03.7 04.8 - 88.9 %   Platelets 265 150 - 400 K/uL   nRBC 0.0 0.0 - 0.2 %   Neutrophils Relative % 89 %  Neutro Abs 20.5 (H) 1.7 - 7.7 K/uL   Lymphocytes Relative 2 %   Lymphs Abs 0.5 (L) 0.7 - 4.0 K/uL   Monocytes Relative 7 %   Monocytes Absolute 1.6 (H) 0.1 - 1.0 K/uL   Eosinophils Relative 0 %   Eosinophils Absolute 0.0 0.0 - 0.5 K/uL   Basophils Relative 0 %   Basophils Absolute 0.1 0.0 - 0.1 K/uL   Immature Granulocytes 2 %   Abs Immature Granulocytes 0.36 (H) 0.00 - 0.07 K/uL    Comment: Performed at [facility], [address] [place], [place] [address]  Comprehensive metabolic panel     Status: Abnormal   Collection Time: 11/16/18  4:04 PM  Result Value Ref Range   Sodium 130 (L) 135 - 145 mmol/L   Potassium 5.4 (H) 3.5 - 5.1 mmol/L   Chloride 100 98 - 111 mmol/L   CO2 21 (L) 22 - 32 mmol/L   Glucose, Bld 245 (H) 70 - 99 mg/dL   BUN 50 (H) 8 - 23 mg/dL   Creatinine, Ser 9.14 (H) 0.61 - 1.24 mg/dL   Calcium 7.8 (L)  8.9 - 10.3 mg/dL   Total Protein 6.3 (L) 6.5 - 8.1 g/dL   Albumin 2.8 (L) 3.5 - 5.0 g/dL   AST 19 15 - 41 U/L   ALT 19 0 - 44 U/L   Alkaline Phosphatase 95 38 - 126 U/L   Total Bilirubin 0.6 0.3 - 1.2 mg/dL   GFR calc non Af Amer 29 (L) >60 mL/min   GFR calc Af Amer 33 (L) >60 mL/min   Anion gap 9 5 - 15    Comment: Performed at [facility], [address] [place], [place] [address]  Type and screen [facility]     Status: None   Collection Time: 11/16/18  4:04 PM  Result Value Ref Range   ABO/RH(D) AB NEG    Antibody Screen NEG    Sample Expiration      11/19/2018 Performed at [facility], [address], [place], [place] [address]    Ct Abdomen Pelvis Wo Contrast  Result Date: 11/16/2018 CLINICAL DATA:  Acute abdominal pain. Hypotensive. Recent abdominal surgery for diverticulitis. EXAM: CT ABDOMEN AND PELVIS WITHOUT CONTRAST TECHNIQUE: Multidetector CT imaging of the abdomen and pelvis was performed following the standard protocol without IV contrast. COMPARISON:  CT abdomen and pelvis November 04, 2018 FINDINGS: LOWER CHEST: Lung bases are clear. Included heart size is normal. Coronary artery calcifications, incompletely assessed. Small pericardial effusion. HEPATOBILIARY: Subcentimeter probable cyst in dome of liver. PANCREAS: Atrophic pancreas. SPLEEN: Normal. ADRENALS/URINARY TRACT: Kidneys are orthotopic, demonstrating normal size and morphology. No nephrolithiasis, hydronephrosis; limited assessment for renal masses by nonenhanced CT. The unopacified ureters are normal in course and caliber. Urinary bladder is partially distended and unremarkable. Normal adrenal glands. STOMACH/BOWEL: Evaluation for bowel pathology decreased without oral contrast. Severe colonic diverticulosis with perforation, unchanged though degree of inflammation has decreased. VASCULAR/LYMPHATIC: Aortoiliac vessels are normal in course and caliber. Moderate calcific  atherosclerosis and old dissection. No lymphadenopathy by CT size criteria. REPRODUCTIVE: Prostatomegaly invading base of bladder. OTHER: Decreased though persistent pneumoperitoneum. 3.3 x 3.3 cm fluid collection with gas LEFT lower quadrant with retaining loop central within the collection. Increased air within the fluid collection. Air tracks along the catheter track into the subcutaneous fat. MUSCULOSKELETAL: Non-acute. Moderate LEFT and small RIGHT fat containing umbilical hernias. Status post RIGHT inguinal herniorrhaphy. IMPRESSION: 1. Decreased inflammatory changes of  recent perforated colonic diverticulitis. 3.3 x 3.3 cm LEFT lower quadrant abscess with increased gas within the collection and along the catheter track. Recommend correlation with drain output; patient may require catheter exchange/upsizing. 2. Residual versus recurrent pneumoperitoneum. 3. Acute findings discussed with and reconfirmed by Dr.PATRICK ROBINSON on 11/16/2018 at 6:10 pm. 4.  Aortic Atherosclerosis (ICD10-I70.0). Electronically Signed   By: Awilda Metro M.D.   On: 11/16/2018 18:11   Dg Chest Portable 1 View  Result Date: 11/16/2018 CLINICAL DATA:  Weakness.  Rule out pneumonia. EXAM: PORTABLE CHEST 1 VIEW COMPARISON:  11/07/2015 FINDINGS: Right arm PICC tip in the SVC. Lungs are clear without infiltrate or effusion. Apical scarring bilaterally. Negative for heart failure. IMPRESSION: No active disease. Electronically Signed   By: Marlan Palau M.D.   On: 11/16/2018 16:45    Pending Labs Unresulted Labs (From admission, onward)   None      Vitals/Pain Today's Vitals   11/16/18 1855 11/16/18 1930 11/16/18 2000 11/16/18 2200  BP: (!) 117/59 (!) 103/48 (!) 104/46 (!) 116/52  Pulse: 65 63  65  Resp: Temp:      TempSrc:      SpO2: 98% 95%  96%  Weight:      Height:        Isolation Precautions No active isolations  Medications Medications  sodium chloride 0.9 % bolus 250 mL ( Intravenous  Stopped 11/16/18 1649)  0.9 %  sodium chloride infusion ( Intravenous Rate/Dose Verify 11/16/18 2013)    Mobility walks with device Low fall risk   Focused Assessments GI- LLQ abd pain since JP drain placement for Diverticulitis. Denies any nausea and vomiting no BM x 3 days. Passing flautus.    R Recommendations: See Admitting Provider Note  Report given to:   Additional Notes: Pt with recent drain placement for diverticulitis, PICC line in right arm being used for outpatient ABX.

## 2018-11-16 NOTE — ED Notes (Signed)
Consulting provider at bedside

## 2018-11-16 NOTE — ED Notes (Signed)
Attempting to draw labs, pt states he has a PICC line in place.

## 2018-11-16 NOTE — ED Triage Notes (Addendum)
From [facility], c/o hypotension. Pt had recent abdominal surgery due to diverticulitis. Pt pale, appears fatigued.  Denies CP or SOB.

## 2018-11-17 ENCOUNTER — Other Ambulatory Visit: Payer: Self-pay

## 2018-11-17 ENCOUNTER — Encounter: Payer: Self-pay | Admitting: *Deleted

## 2018-11-17 DIAGNOSIS — Z515 Encounter for palliative care: Secondary | ICD-10-CM

## 2018-11-17 DIAGNOSIS — K5720 Diverticulitis of large intestine with perforation and abscess without bleeding: Secondary | ICD-10-CM

## 2018-11-17 DIAGNOSIS — Z7189 Other specified counseling: Secondary | ICD-10-CM

## 2018-11-17 DIAGNOSIS — R1032 Left lower quadrant pain: Secondary | ICD-10-CM

## 2018-11-17 LAB — GLUCOSE, CAPILLARY: Glucose-Capillary: 102 mg/dL — ABNORMAL HIGH (ref 70–99)

## 2018-11-17 MED ORDER — MAGNESIUM HYDROXIDE 400 MG/5ML PO SUSP
5.0000 mL | Freq: Every day | ORAL | Status: DC | PRN
  Administered 2018-11-17: 5 mL via ORAL
  Filled 2018-11-17 (×2): qty 30

## 2018-11-17 MED ORDER — TRAMADOL HCL 50 MG PO TABS
50.0000 mg | ORAL_TABLET | Freq: Four times a day (QID) | ORAL | Status: DC | PRN
  Administered 2018-11-17: 10:00:00 50 mg via ORAL
  Filled 2018-11-17: qty 1

## 2018-11-17 MED ORDER — LORAZEPAM 2 MG/ML PO CONC: 1.0000 mg | ORAL | Status: DC | PRN

## 2018-11-17 MED ORDER — LORAZEPAM 1 MG PO TABS: 1.0000 mg | ORAL_TABLET | ORAL | Status: DC | PRN

## 2018-11-17 MED ORDER — MORPHINE SULFATE (PF) 4 MG/ML IV SOLN: 4.0000 mg | INTRAVENOUS | Status: DC | PRN

## 2018-11-17 MED ORDER — GLIPIZIDE 5 MG PO TABS
2.5000 mg | ORAL_TABLET | Freq: Every day | ORAL | Status: DC
  Administered 2018-11-17: 08:00:00 2.5 mg via ORAL
  Filled 2018-11-17: qty 0.5

## 2018-11-17 MED ORDER — ONDANSETRON HCL 4 MG/2ML IJ SOLN: 4.0000 mg | Freq: Four times a day (QID) | INTRAMUSCULAR | Status: DC | PRN

## 2018-11-17 MED ORDER — METRONIDAZOLE 500 MG PO TABS
500.0000 mg | ORAL_TABLET | Freq: Three times a day (TID) | ORAL | Status: DC
  Administered 2018-11-17: 500 mg via ORAL
  Filled 2018-11-17: qty 1

## 2018-11-17 MED ORDER — GLYCOPYRROLATE 0.2 MG/ML IJ SOLN
0.2000 mg | INTRAMUSCULAR | Status: DC | PRN
  Filled 2018-11-17: qty 1

## 2018-11-17 MED ORDER — HYDROCODONE-ACETAMINOPHEN 5-325 MG PO TABS
1.0000 | ORAL_TABLET | ORAL | Status: DC | PRN
  Administered 2018-11-17: 1 via ORAL
  Filled 2018-11-17: qty 1

## 2018-11-17 MED ORDER — PANTOPRAZOLE SODIUM 40 MG IV SOLR
40.0000 mg | Freq: Every day | INTRAVENOUS | Status: DC
  Administered 2018-11-17: 01:00:00 40 mg via INTRAVENOUS
  Filled 2018-11-17: qty 40

## 2018-11-17 MED ORDER — DOCUSATE SODIUM 100 MG PO CAPS: 100.0000 mg | ORAL_CAPSULE | Freq: Two times a day (BID) | ORAL | Status: DC | PRN

## 2018-11-17 MED ORDER — CLOPIDOGREL BISULFATE 75 MG PO TABS
75.0000 mg | ORAL_TABLET | Freq: Every day | ORAL | Status: DC
  Administered 2018-11-17: 75 mg via ORAL
  Filled 2018-11-17 (×2): qty 1

## 2018-11-17 MED ORDER — ASPIRIN EC 81 MG PO TBEC
81.0000 mg | DELAYED_RELEASE_TABLET | Freq: Every day | ORAL | Status: DC
  Administered 2018-11-17: 08:00:00 81 mg via ORAL
  Filled 2018-11-17 (×2): qty 1

## 2018-11-17 MED ORDER — CEFTRIAXONE IV (FOR PTA / DISCHARGE USE ONLY): 2.0000 g | INTRAVENOUS | Status: DC

## 2018-11-17 MED ORDER — OXYCODONE HCL 20 MG/ML PO CONC
5.0000 mg | ORAL | Status: DC | PRN
  Administered 2018-11-17: 5 mg via ORAL
  Filled 2018-11-17: qty 1

## 2018-11-17 MED ORDER — OCUVITE-LUTEIN PO CAPS
1.0000 | ORAL_CAPSULE | Freq: Every day | ORAL | Status: DC
  Administered 2018-11-17: 1 via ORAL
  Filled 2018-11-17: qty 1

## 2018-11-17 MED ORDER — GLYCOPYRROLATE 1 MG PO TABS
1.0000 mg | ORAL_TABLET | ORAL | Status: DC | PRN
  Filled 2018-11-17: qty 1

## 2018-11-17 MED ORDER — METOPROLOL TARTRATE 25 MG PO TABS
25.0000 mg | ORAL_TABLET | Freq: Every day | ORAL | Status: DC
  Administered 2018-11-17: 10:00:00 25 mg via ORAL
  Filled 2018-11-17: qty 1

## 2018-11-17 MED ORDER — LACTATED RINGERS IV SOLN
INTRAVENOUS | Status: DC
  Administered 2018-11-17: 01:00:00 via INTRAVENOUS

## 2018-11-17 MED ORDER — LISINOPRIL 5 MG PO TABS
2.5000 mg | ORAL_TABLET | Freq: Every day | ORAL | Status: DC
  Administered 2018-11-17: 2.5 mg via ORAL
  Filled 2018-11-17: qty 1

## 2018-11-17 MED ORDER — ONDANSETRON 4 MG PO TBDP: 4.0000 mg | ORAL_TABLET | Freq: Four times a day (QID) | ORAL | Status: DC | PRN

## 2018-11-17 MED ORDER — HYDROMORPHONE HCL 1 MG/ML IJ SOLN: 0.5000 mg | INTRAMUSCULAR | Status: DC | PRN

## 2018-11-17 MED ORDER — CYANOCOBALAMIN 1000 MCG/ML IJ SOLN
1000.0000 ug | INTRAMUSCULAR | Status: DC
  Administered 2018-11-17: 1000 ug via INTRAMUSCULAR
  Filled 2018-11-17: qty 1

## 2018-11-17 MED ORDER — OXYCODONE HCL 20 MG/ML PO CONC
5.0000 mg | ORAL | Status: DC | PRN
  Administered 2018-11-18: 10:00:00 5 mg via SUBLINGUAL
  Filled 2018-11-17: qty 1

## 2018-11-17 MED ORDER — FINASTERIDE 5 MG PO TABS
5.0000 mg | ORAL_TABLET | Freq: Every day | ORAL | Status: DC
  Administered 2018-11-17: 08:00:00 5 mg via ORAL
  Filled 2018-11-17 (×2): qty 1

## 2018-11-17 MED ORDER — NITROGLYCERIN 0.4 MG SL SUBL: 0.4000 mg | SUBLINGUAL_TABLET | SUBLINGUAL | Status: DC | PRN

## 2018-11-17 MED ORDER — GABAPENTIN 100 MG PO CAPS
100.0000 mg | ORAL_CAPSULE | Freq: Every day | ORAL | Status: DC
  Administered 2018-11-17: 10:00:00 100 mg via ORAL
  Filled 2018-11-17: qty 1

## 2018-11-17 MED ORDER — SODIUM CHLORIDE 0.9 % IV SOLN
2.0000 g | INTRAVENOUS | Status: DC
  Administered 2018-11-17: 04:00:00 2 g via INTRAVENOUS
  Filled 2018-11-17: qty 2
  Filled 2018-11-17: qty 20

## 2018-11-17 MED ORDER — CELECOXIB 200 MG PO CAPS
200.0000 mg | ORAL_CAPSULE | Freq: Every day | ORAL | Status: DC
  Administered 2018-11-17: 200 mg via ORAL
  Filled 2018-11-17: qty 1

## 2018-11-17 MED ORDER — LORAZEPAM 2 MG/ML IJ SOLN: 1.0000 mg | INTRAMUSCULAR | Status: DC | PRN

## 2018-11-17 MED ORDER — ATORVASTATIN CALCIUM 20 MG PO TABS: 40.0000 mg | ORAL_TABLET | Freq: Every day | ORAL | Status: DC

## 2018-11-17 NOTE — H&P (Signed)
Subjective:   CC: persistent diverticulitis  HPI:  Benjamin Parks is a 85 y.o. male  who is here for followup from above.  Increased somnolence and pain in abdomen since discharge.  Percocet not helping with pain.  He has had decreased appetite as well.  Noted to be hypotensive and somnolent during his office visit follow-up so was transferred to the ED for further work-up.  Noted to have increased leukocytosis and acute kidney injury, with CT showing improved inflammation in the area of concern but still with persistent fluid and air tracking along the drain.  Past Medical History:  has a past medical history of Diabetes mellitus without complication (HCC), History of heart attack, Hyperlipidemia, Hypertension, PVC (premature ventricular contraction), Right bundle branch block, and Stroke (HCC).  Past Surgical History:  Past Surgical History:  Procedure Laterality Date  . APPENDECTOMY    . CARDIAC CATHETERIZATION N/A 11/08/2015   Procedure: Left Heart Cath and Coronary Angiography;  Surgeon: Dalia Heading, MD;  Location: [facility] INVASIVE CV LAB;  Service: Cardiovascular;  Laterality: N/A;  . HERNIA REPAIR    . none    . TONSILLECTOMY      Family History: family history includes Heart attack in his father and mother.  Social History:  reports that he has quit smoking. He has never used smokeless tobacco. He reports current alcohol use. He reports that he does not use drugs.  Current Medications:  Medications Prior to Admission  Medication Sig Dispense Refill  . aspirin EC 81 MG EC tablet Take 1 tablet (81 mg total) by mouth daily. 30 tablet 0  . atorvastatin (LIPITOR) 80 MG tablet Take 0.5 tablets (40 mg total) by mouth daily at 6 PM. 30 tablet 0  . cefTRIAXone (ROCEPHIN) IVPB Inject 2 g into the vein daily for 7 days. Indication: diverticular abscess Last Day of Therapy:  11/18/2018 Labs - Once weekly:  CBC/D and BMP, 7 Units 0  . celecoxib (CELEBREX) 200 MG capsule Take 1 capsule (200 mg  total) by mouth daily. 30 capsule 0  . clopidogrel (PLAVIX) 75 MG tablet Take 1 tablet (75 mg total) by mouth daily. 30 tablet 0  . cyanocobalamin (,VITAMIN B-12,) 1000 MCG/ML injection Inject 1,000 mcg into the muscle every 30 (thirty) days.    . finasteride (PROSCAR) 5 MG tablet Take 5 mg by mouth daily.  1  . gabapentin (NEURONTIN) 100 MG capsule Take 1 capsule (100 mg total) by mouth daily. 30 capsule 0  . glipiZIDE (GLUCOTROL) 5 MG tablet Take 0.5 tablets (2.5 mg total) by mouth daily before breakfast. 30 tablet 0  . HYDROcodone-acetaminophen (NORCO/VICODIN) 5-325 MG tablet Take 1 tablet by mouth every 6 (six) hours as needed for moderate pain. 15 tablet 0  . lisinopril (PRINIVIL,ZESTRIL) 2.5 MG tablet Take 1 tablet (2.5 mg total) by mouth daily. 30 tablet 0  . metoprolol tartrate (LOPRESSOR) 25 MG tablet Take 1 tablet (25 mg total) by mouth 2 (two) times daily. (Patient taking differently: Take 25 mg by mouth daily. ) 30 tablet 0  . metroNIDAZOLE (FLAGYL) 500 MG tablet Take 1 tablet (500 mg total) by mouth 3 (three) times daily for 7 days. 42 tablet 0  . Multiple Vitamins-Minerals (PRESERVISION AREDS) CAPS Take 1 capsule by mouth daily.     Marland Kitchen triamcinolone cream (KENALOG) 0.1 % APPLY TO AFFECTED AREA TWICE A DAY  0  . Dextran 70-Hypromellose (ARTIFICIAL TEARS) 0.1-0.3 % SOLN Apply to eye.    . Lancets (ACCU-CHEK MULTICLIX) lancets Use  as instructed 100 each 12  . nitroGLYCERIN (NITROSTAT) 0.4 MG SL tablet Place 1 tablet (0.4 mg total) under the tongue every 5 (five) minutes x 3 doses as needed for chest pain. 30 tablet 0  . oxyCODONE-acetaminophen (PERCOCET) 10-325 MG tablet Take 1 tablet by mouth every 6 (six) hours as needed for pain.      Allergies:  Allergies as of 11/16/2018 - Review Complete 11/16/2018  Allergen Reaction Noted  . Azithromycin  11/07/2015  . Erythromycin base  12/29/2012  . Metformin and related Other (See Comments) 01/03/2017    ROS:  General: Denies weight  loss, weight gain, fevers, chills, and night sweats. Eyes: Denies blurry vision, double vision, eye pain, itchy eyes, and tearing. Ears: Denies hearing loss, earache, and ringing in ears. Nose: Denies sinus pain, congestion, infections, runny nose, and nosebleeds. Mouth/throat: Denies hoarseness, sore throat, bleeding gums, and difficulty swallowing. Heart: Denies chest pain, palpitations, racing heart, irregular heartbeat, leg pain or swelling, and decreased activity tolerance. Respiratory: Denies breathing difficulty, shortness of breath, wheezing, cough, and sputum. GI: Denies change in appetite, heartburn, nausea, vomiting, constipation, diarrhea, and blood in stool. GU: Denies difficulty urinating, pain with urinating, urgency, frequency, blood in urine. Musculoskeletal: Denies joint stiffness, pain, swelling, muscle weakness. Skin: Denies rash, itching, mass, tumors, sores, and boils Neurologic: Denies headache, fainting, dizziness, seizures, numbness, and tingling. Psychiatric: Denies depression, anxiety, difficulty sleeping, and memory loss. Endocrine: Denies heat or cold intolerance, and increased thirst or urination. Blood/lymph: Denies easy bruising, easy bruising, and swollen glands     Objective:     BP (!) 113/48 (BP Location: Left Arm)   Pulse 78   Temp 98.3 F (36.8 C) (Oral)   Resp 18   Ht 5\' 10"  (1.778 m)   Wt 77 kg   SpO2 98%   BMI 24.36 kg/m   Constitutional :  alert, cooperative, appears stated age and no distress  Lymphatics/Throat:  no asymmetry, masses, or scars  Respiratory:  clear to auscultation bilaterally  Cardiovascular:  regular rate and rhythm  Gastrointestinal: soft, distended, with increased tenderness around incision site and suprapubic region compared to last exam.  .   Musculoskeletal: Steady gait and movement  Skin: Cool and moist   Psychiatric: Normal affect, non-agitated, not confused       LABS:  CMP Latest Ref Rng & Units 11/16/2018  11/12/2018 11/11/2018  Glucose 70 - 99 mg/dL 229(N) 989(Q) 119(E)  BUN 8 - 23 mg/dL 17(E) 08(X) 23  Creatinine 0.61 - 1.24 mg/dL 4.48(J) 8.56 3.14  Sodium 135 - 145 mmol/L 130(L) 134(L) 134(L)  Potassium 3.5 - 5.1 mmol/L 5.4(H) 4.9 4.8  Chloride 98 - 111 mmol/L 100 103 103  CO2 22 - 32 mmol/L 21(L) 24 23  Calcium 8.9 - 10.3 mg/dL 7.8(L) 8.2(L) 8.5(L)  Total Protein 6.5 - 8.1 g/dL 6.3(L) - -  Total Bilirubin 0.3 - 1.2 mg/dL 0.6 - -  Alkaline Phos 38 - 126 U/L 95 - -  AST 15 - 41 U/L 19 - -  ALT 0 - 44 U/L 19 - -   CBC Latest Ref Rng & Units 11/16/2018 11/12/2018 11/11/2018  WBC 4.0 - 10.5 K/uL 23.0(H) 8.0 9.4  Hemoglobin 13.0 - 17.0 g/dL 12.0(L) 12.8(L) 12.8(L)  Hematocrit 39.0 - 52.0 % 37.3(L) 38.9(L) 38.8(L)  Platelets 150 - 400 K/uL 265 251 240    RADS: CLINICAL DATA:  Acute abdominal pain. Hypotensive. Recent abdominal surgery for diverticulitis.  EXAM: CT ABDOMEN AND PELVIS WITHOUT CONTRAST  TECHNIQUE: Multidetector CT imaging of the abdomen and pelvis was performed following the standard protocol without IV contrast.  COMPARISON:  CT abdomen and pelvis November 04, 2018  FINDINGS: LOWER CHEST: Lung bases are clear. Included heart size is normal. Coronary artery calcifications, incompletely assessed. Small pericardial effusion.  HEPATOBILIARY: Subcentimeter probable cyst in dome of liver.  PANCREAS: Atrophic pancreas.  SPLEEN: Normal.  ADRENALS/URINARY TRACT: Kidneys are orthotopic, demonstrating normal size and morphology. No nephrolithiasis, hydronephrosis; limited assessment for renal masses by nonenhanced CT. The unopacified ureters are normal in course and caliber. Urinary bladder is partially distended and unremarkable. Normal adrenal glands.  STOMACH/BOWEL: Evaluation for bowel pathology decreased without oral contrast. Severe colonic diverticulosis with perforation, unchanged though degree of inflammation has decreased.  VASCULAR/LYMPHATIC:  Aortoiliac vessels are normal in course and caliber. Moderate calcific atherosclerosis and old dissection. No lymphadenopathy by CT size criteria.  REPRODUCTIVE: Prostatomegaly invading base of bladder.  OTHER: Decreased though persistent pneumoperitoneum. 3.3 x 3.3 cm fluid collection with gas LEFT lower quadrant with retaining loop central within the collection. Increased air within the fluid collection. Air tracks along the catheter track into the subcutaneous fat.  MUSCULOSKELETAL: Non-acute. Moderate LEFT and small RIGHT fat containing umbilical hernias. Status post RIGHT inguinal herniorrhaphy.  IMPRESSION: 1. Decreased inflammatory changes of recent perforated colonic diverticulitis. 3.3 x 3.3 cm LEFT lower quadrant abscess with increased gas within the collection and along the catheter track. Recommend correlation with drain output; patient may require catheter exchange/upsizing. 2. Residual versus recurrent pneumoperitoneum. 3. Acute findings discussed with and reconfirmed by Dr.PATRICK ROBINSON on 11/16/2018 at 6:10 pm. 4.  Aortic Atherosclerosis (ICD10-I70.0).   Electronically Signed   By: Awilda Metro M.D.   On: 11/16/2018 18:11 Assessment:   Persistent sigmoid diverticulitis. Hypotension Acute kidney injury Altered mental status  Plan:    Fluid resuscitation in the emergency department did improve his mentation and blood pressure into the stable range.  Lab work and physical exam specifically abdominal, shows persistent diverticulitis not improving with nonsurgical management.  We again discussed possible surgery to address the issue.  Surgery will be high risk due to his comorbidities, and he will have a colostomy after the Hartman's procedure that is planned.  At this point [name] still is persistently refusing the surgery, stating he does not want to undergo additional pain and recovery from said surgery only to end up living with a colostomy  afterwards.  We did discuss palliative care options including comfort care to make sure that he is comfortable, he was amenable to this option at this time.  He was however not amenable to full withdrawal of care including stopping antibiotics.  We will admit him for better pain control and continue IV antibiotic therapy for now, although this unlikely is not improving nor maintaining his current status.  We will have palliative care consult to follow-up to discuss further his options if we decide to continue with nonsurgical management.  Patient in agreement along with the son who was at bedside.

## 2018-11-17 NOTE — Progress Notes (Addendum)
New hospice home referral received from [place] following a Palliative Medicine consult. Patient is a 85 year old man with a known history of diverticulitis, diverticulosis, Arrhthymia, osteoarthritis, HTN, DM II and HLD, admitted to [facility] for [facility] rehab with abdominal pain and lethargy. He was just discharged on 2/28 following treatment for diverticulitis and abscess. On admission he was found to have continued abscess and increased gas. Patient has not responded to non-surgical treatment for persistent diverticulitis. Palliative medicine was consulted for goals of care. Palliative NP Kathie Rhodes met today with patient and his son, they have chosen to focus on comfort with transfer to the hospice home. Writer met in the room with patient and his son Wassim to initiate education regarding hospice services, philosophy and team approach to care with understanding voiced. Patient was alert, but did not participate in the conversation. He did get up with assistance and walker to use th bathroom. When back to bed, he was very fatigued and appeared to be sleeping. He has taken Vicodin 5/325  And tramadol today for pain. New orders placed following Palliative consult for IV dilaudid q 2 hrs PRN. Patient's son Hadley report very little oral since admission and decreased appetite prior to admission. Possible discharge tomorrow 3/5 to the hospice home. Abdikadir voiced understanding. Hospital care team updated. Patient information faxed to referral. Flo Shanks BSN, RN, [facility] Liaison [facility] [phone]

## 2018-11-17 NOTE — Progress Notes (Signed)
Pt on comfort measures.  Resting comfortably. Up to the chair for meals. Oral pain meds given as needed with improvement. Scant amt drainage in JP.

## 2018-11-17 NOTE — Progress Notes (Signed)
Subjective:  CC:  Benjamin Parks is a 85 y.o. male  Hospital stay day 2,   sigmoid diverticulitis  HPI: Pain still present, but not other complaints.  ROS:  General: Denies weight loss, weight gain, fatigue, fevers, chills, and night sweats. Heart: Denies chest pain, palpitations, racing heart, irregular heartbeat, leg pain or swelling, and decreased activity tolerance. Respiratory: Denies breathing difficulty, shortness of breath, wheezing, cough, and sputum. GI: Denies change in appetite, heartburn, nausea, vomiting, constipation, diarrhea, and blood in stool. GU: Denies difficulty urinating, pain with urinating, urgency, frequency, blood in urine.   Objective:      Temp:  [97.5 F (36.4 C)-98.4 F (36.9 C)] 98.3 F (36.8 C) (03/04 0807) Pulse Rate:  [56-78] 78 (03/04 0807) Resp:  [11-18] 18 (03/04 0807) BP: (92-131)/(32-59) 113/48 (03/04 0807) SpO2:  [93 %-100 %] 98 % (03/04 0807) Weight:  [77 kg] 77 kg (03/03 1540)     Height: 5\' 10"  (177.8 cm) Weight: 77 kg BMI (Calculated): 24.36   Intake/Output this shift:   Intake/Output Summary (Last 24 hours) at 11/17/2018 1053 Last data filed at 11/17/2018 0900 Gross per 24 hour  Intake 938.42 ml  Output 30 ml  Net 908.42 ml     feculant drainage in JP bulb    Constitutional :  alert, cooperative, appears stated age and no distress  Respiratory:  clear to auscultation bilaterally  Cardiovascular:  regular rate and rhythm  Gastrointestinal: soft, persistant supraumbilical tenderness, but continuing to  improve from last exam.   Skin: Cool and moist.   Psychiatric: Normal affect, non-agitated, not confused       LABS:  CMP Latest Ref Rng & Units 11/16/2018 11/12/2018 11/11/2018  Glucose 70 - 99 mg/dL 102(H) 852(D) 782(U)  BUN 8 - 23 mg/dL 23(N) 36(R) 23  Creatinine 0.61 - 1.24 mg/dL 4.43(X) 5.40 0.86  Sodium 135 - 145 mmol/L 130(L) 134(L) 134(L)  Potassium 3.5 - 5.1 mmol/L 5.4(H) 4.9 4.8  Chloride 98 - 111 mmol/L 100 103 103   CO2 22 - 32 mmol/L 21(L) 24 23  Calcium 8.9 - 10.3 mg/dL 7.8(L) 8.2(L) 8.5(L)  Total Protein 6.5 - 8.1 g/dL 6.3(L) - -  Total Bilirubin 0.3 - 1.2 mg/dL 0.6 - -  Alkaline Phos 38 - 126 U/L 95 - -  AST 15 - 41 U/L 19 - -  ALT 0 - 44 U/L 19 - -   CBC Latest Ref Rng & Units 11/16/2018 11/12/2018 11/11/2018  WBC 4.0 - 10.5 K/uL 23.0(H) 8.0 9.4  Hemoglobin 13.0 - 17.0 g/dL 12.0(L) 12.8(L) 12.8(L)  Hematocrit 39.0 - 52.0 % 37.3(L) 38.9(L) 38.8(L)  Platelets 150 - 400 K/uL 265 251 240    RADS: n/a Assessment:   Sigmoid diverticulitis.  Pt clear minded and alert, back to baseline this am.  He still declines any surgical intervention, understanding that no further medical management will likely improve his current status.  He specifically requests palliative care at this point.  Son at bedside also is agreeable.  Palliative care team updated, and they will start making arrangements.

## 2018-11-17 NOTE — Progress Notes (Signed)
Notified Dr Tonna Boehringer of BP 113/44 and BP meds due. IVF order expired. Per MD to d/c IVF and OK to give BP meds.

## 2018-11-17 NOTE — Clinical Social Work Note (Signed)
Clinical Social Work Assessment  Patient Details  Name: Benjamin Parks MRN: [id number] Date of Birth: 03-Feb-1919  Date of referral:  11/17/18               Reason for consult:  Facility Placement                Permission sought to share information with:  Case Manager, Magazine features editor, Family Supports Permission granted to share information::  Yes, Verbal Permission Granted  Name::        Agency::     Relationship::     Contact Information:     Housing/Transportation Living arrangements for the past 2 months:  Market researcher of Information:  Adult Children Patient Interpreter Needed:  None Criminal Activity/Legal Involvement Pertinent to Current Situation/Hospitalization:  No - Comment as needed Significant Relationships:  Adult Children Lives with:  Facility Resident Do you feel safe going back to the place where you live?  Yes Need for family participation in patient care:  Yes (Comment)  Care giving concerns:  Patient is from [facility]   Social Worker assessment / plan:  CSW consulted for discharge planning. Patient is from [facility]. Patient is not able to complete assessment but son Benjamin Parks is at bedside. Son states that they are interested in hospice home in [place]. CSW notified Clydie Braun, hospice liaison of referral. CSW also notified Sue Lush at [facility] of patient going to hospice home. CSW will continue to follow for discharge planning.   Employment status:  Retired Health and safety inspector:  Medicare PT Recommendations:  Not assessed at this time Information / Referral to community resources:     Patient/Family's Response to care:  Son thanked CSW for assistance   Patient/Family's Understanding of and Emotional Response to Diagnosis, Current Treatment, and Prognosis:  Son understands that patient is end of life.   Emotional Assessment Appearance:  Appears stated age Attitude/Demeanor/Rapport:    Affect (typically  observed):    Orientation:  Oriented to Self Alcohol / Substance use:  Not Applicable Psych involvement (Current and /or in the community):  No (Comment)  Discharge Needs  Concerns to be addressed:  Discharge Planning Concerns Readmission within the last 30 days:  Yes Current discharge risk:  None Barriers to Discharge:  Continued Medical Work up   [employer], LCSWA 11/17/2018, 1:07 PM

## 2018-11-17 NOTE — Consult Note (Signed)
Consultation Note Date: 11/17/2018   Patient Name: Benjamin Parks  DOB: August 06, 1919  MRN: [id number]  Age / Sex: 85 y.o., male  PCP: Maryland Pink, MD Referring Physician: [name], DO  Reason for Consultation: Establishing goals of care, Inpatient hospice referral, Non pain symptom management, Pain control, Psychosocial/spiritual support and Terminal Care  HPI/Patient Profile: 85 y.o. male  with past medical history of DM, MI, HLD, HTN, PVC, and CVA admitted on 11/16/2018 with abdominal pain and somnolence. Recent hospital admission with diverticulitis and abscess - d/c'd 2/28. Since discharge, pain has continued. Found to be hypotensive. Labs revealed leukocytosis and AKI. CT abdomen revealed decreased inflammation, but abscess and increased gas along drain. Patient given fluids and IV antibiotics. Patient treated for persistent diverticulitis not responsive to nonsurgical management. PMT consulted for [place].  Clinical Assessment and Goals of Care: I have reviewed medical records including EPIC notes, labs and imaging, received report from Dr. Lysle Pearl, assessed the patient and then met with patient and son, Benjamin Parks, to discuss diagnosis prognosis, [place], EOL wishes, disposition and options.  Patient's participation in [place] conversation is limited d/t somnolence.  I introduced Palliative Medicine as specialized medical care for people living with serious illness. It focuses on providing relief from the symptoms and stress of a serious illness. The goal is to improve quality of life for both the patient and the family.  As far as functional and nutritional status, son tells me patient was independent and eating well prior to illness. Since recent discharge on 2/28, patient has been declining with abdominal pain and poor PO intake.   We discussed his current illness and what it means in the larger context of his on-going co-morbidities.  Natural disease  trajectory and expectations at EOL were discussed. We discussed option of surgery and [name] told me about his discussion with his father and Dr. Lysle Pearl. [name] understands that patient does not want surgery/colostomy. He also understands patient is not responding to nonsurgical management. [name] and patient agree they would like to transition to comfort care and focus on symptom management.  Hospice and Palliative Care services outpatient were explained and offered. We discussed hospice facility as an option for Benjamin Parks vs hosp[facility] care at [facility]. After discussion, [name] agrees that hospice facility is most appropriate for [name] d/t his symptom management needs.   Brandt expresses understanding of poor prognosis. Patient does as well although his understanding of situation is questionable.  Questions and concerns were addressed. The family was encouraged to call with questions or concerns.   Primary Decision Maker NEXT OF KIN - son, Benjamin Parks - joined by patient as he is intermittently alert and oriented  SUMMARY OF RECOMMENDATIONS   - transition to full comfort care - no surgery, no medications not needed for patient's comfort - CSW consult for residential hospice - family chooses [place] hospice  Code Status/Advance Care Planning:  DNR   Symptom Management:   Avoid morphine in setting of AKI - 0.5 mg dilaudid q2hr prn severe pain or dyspnea  Oxycodone solution 5 mg q2hr PRN moderate pain or dyspnea  Ativan 1 mg q4hr PRN anxiety  Robinul 1 mg q4hr PRN excessive secretions  Continue zofran PRN  Palliative Prophylaxis:   Aspiration, Frequent Pain Assessment, Oral Care and Turn Reposition  Additional Recommendations (Limitations, Scope, Preferences):  Full Comfort Care  Psycho-social/Spiritual:   Desire for further Chaplaincy support:no  Additional Recommendations: Education on Hospice  Prognosis:   < 2 weeks  Discharge Planning: Hospice facility  Primary Diagnoses: Present on Admission: . Diverticulitis large intestine   I have reviewed the medical record, interviewed the patient and family, and examined the patient. The following aspects are pertinent.  Past Medical History:  Diagnosis Date  . Diabetes mellitus without complication ([place])   . History of heart attack   . Hyperlipidemia   . Hypertension   . PVC (premature ventricular contraction)   . Right bundle branch block   . Stroke [facility])    Social History   Socioeconomic History  . Marital status: Widowed    Spouse name: Not on file  . Number of children: 2  . Years of education: Not on file  . Highest education level: Doctorate  Occupational History  . Occupation: retired.  Social Needs  . Financial resource strain: Not on file  . Food insecurity:    Worry: Never true    Inability: Never true  . Transportation needs:    Medical: No    Non-medical: No  Tobacco Use  . Smoking status: Former Research scientist (life sciences)  . Smokeless tobacco: Never Used  Substance and Sexual Activity  . Alcohol use: Yes    Alcohol/week: 0.0 standard drinks    Comment: one cocktail every day.  . Drug use: No  . Sexual activity: Never  Lifestyle  . Physical activity:    Days per week: 3 days    Minutes per session: 30 min  . Stress: Not at all  Relationships  . Social connections:    Talks on phone: Patient refused    Gets together: Patient refused    Attends religious service: Patient refused    Active member of club or organization: Patient refused    Attends meetings of clubs or organizations: Patient refused    Relationship status: Patient refused  Other Topics Concern  . Not on file  Social History Narrative  . Not on file   Family History  Problem Relation Age of Onset  . Heart attack Mother        deceased  . Heart attack Father        deceased   Scheduled Meds: Continuous Infusions: PRN Meds:.glycopyrrolate **OR** glycopyrrolate **OR** glycopyrrolate, HYDROmorphone  (DILAUDID) injection, LORazepam **OR** LORazepam **OR** LORazepam, ondansetron **OR** ondansetron (ZOFRAN) IV, oxyCODONE **OR** oxyCODONE Allergies  Allergen Reactions  . Azithromycin   . Erythromycin Base     Other reaction(s): UNKNOWN  . Metformin And Related Other (See Comments)    unknown   Review of Systems  Constitutional: Positive for activity change, appetite change and fatigue.  Gastrointestinal: Positive for abdominal pain.  Neurological: Positive for weakness.  All other systems reviewed and are negative.   Physical Exam Constitutional:      General: He is not in acute distress. Cardiovascular:     Rate and Rhythm: Normal rate and regular rhythm.  Pulmonary:     Effort: Pulmonary effort is normal.  Musculoskeletal:     Right lower leg: No edema.     Left lower leg: No edema.  Skin:    General: Skin is warm and dry.  Neurological:     Mental Status: He is lethargic and disoriented.  Psychiatric:        Cognition and Memory: Cognition is impaired. Memory is impaired.     Vital Signs: BP (!) 113/48 (BP Location: Left Arm)   Pulse 78   Temp 98.3 F (36.8 C) (Oral)   Resp 18   Ht _0  (1.778 m)   Wt 77 kg   SpO2  98%   BMI 24.36 kg/m  Pain Scale: 0-10   Pain Score: 4    SpO2: SpO2: 98 % O2 Device:SpO2: 98 % O2 Flow Rate: .   IO: Intake/output summary:   Intake/Output Summary (Last 24 hours) at 11/17/2018 1039 Last data filed at 11/17/2018 0900 Gross per 24 hour  Intake 938.42 ml  Output 30 ml  Net 908.42 ml    LBM: Last BM Date: 11/14/18 Baseline Weight: Weight: 77 kg Most recent weight: Weight: 77 kg     Palliative Assessment/Data: PPS 20%   Flowsheet Rows     Most Recent Value  Intake Tab  Referral Department  Surgery  Unit at Time of Referral  ER  Date Notified  11/17/18  Palliative Care Type  New Palliative care  Reason for referral  Non-pain Symptom, Clarify Goals of Care  Date of Admission  11/16/18  # of days IP prior to  Palliative referral  1  Clinical Assessment  Psychosocial & Spiritual Assessment  Palliative Care Outcomes     Time Total: 70 minutes Greater than 50%  of this time was spent counseling and coordinating care related to the above assessment and plan.  Juel Burrow, DNP, AGNP-C Palliative Medicine Team [phone] Pager: [phone]

## 2018-11-18 MED ORDER — MAGNESIUM CITRATE PO SOLN: 0.5000 | Freq: Once | ORAL | Status: AC

## 2018-11-18 MED ORDER — HYDROMORPHONE HCL 1 MG/ML IJ SOLN: 0.5000 mg | INTRAMUSCULAR | 0 refills | Status: AC | PRN

## 2018-11-18 MED ORDER — MAGNESIUM CITRATE PO SOLN
0.5000 | Freq: Once | ORAL | Status: AC
  Administered 2018-11-18: 0.5 via ORAL
  Filled 2018-11-18: qty 296

## 2018-11-18 MED ORDER — SIMETHICONE 80 MG PO CHEW
80.0000 mg | CHEWABLE_TABLET | Freq: Four times a day (QID) | ORAL | Status: DC | PRN
  Filled 2018-11-18: qty 1

## 2018-11-18 MED ORDER — GLYCOPYRROLATE 1 MG PO TABS: 1.0000 mg | ORAL_TABLET | ORAL | Status: AC | PRN

## 2018-11-18 MED ORDER — SIMETHICONE 80 MG PO CHEW: 80.0000 mg | CHEWABLE_TABLET | Freq: Four times a day (QID) | ORAL | 0 refills | Status: AC | PRN

## 2018-11-18 MED ORDER — OXYCODONE HCL 20 MG/ML PO CONC: 5.0000 mg | ORAL | 0 refills | Status: AC | PRN

## 2018-11-18 MED ORDER — ONDANSETRON 4 MG PO TBDP: 4.0000 mg | ORAL_TABLET | Freq: Four times a day (QID) | ORAL | 0 refills | Status: AC | PRN

## 2018-11-18 MED ORDER — DOCUSATE SODIUM 100 MG PO CAPS: 100.0000 mg | ORAL_CAPSULE | Freq: Two times a day (BID) | ORAL | 0 refills | Status: AC | PRN

## 2018-11-18 MED ORDER — MAGNESIUM HYDROXIDE 400 MG/5ML PO SUSP: 5.0000 mL | Freq: Every day | ORAL | 0 refills | Status: AC | PRN

## 2018-11-18 MED ORDER — LORAZEPAM 1 MG PO TABS: 1.0000 mg | ORAL_TABLET | ORAL | 0 refills | Status: AC | PRN

## 2018-11-18 NOTE — Clinical Social Work Note (Signed)
Patient to be d/c'ed today to Hospice home [facility] (formerly [facility] [place]).  Patient and family agreeable to plans will transport via ems [facility] hospice nurse liaison RN to call report.  Windell Moulding, MSW, LCSW [phone]

## 2018-11-18 NOTE — Discharge Summary (Signed)
Physician Discharge Summary  Patient ID: Benjamin Parks MRN: [id number] DOB/AGE: 83-Jan-1920 83 y.o.  Admit date: 11/16/2018 Discharge date: 11/18/2018  Admission Diagnoses: sigmoid diverticulitis  Discharge Diagnoses:  Same as above  Discharged Condition: stable  Hospital Course: readmitted for persist diverticulitis, despite IV abx therapy and drain placement.  Pt declined surgical intervention and wants to be "comfortable".  Palliative care consult placed, discharge to hospice.  Please see progress notes for the extensive discussion provided multiple times throughout patient's hosptial admissions.  Consults: palliative care  Discharge Exam: Blood pressure (!) 128/51, pulse 60, temperature 98.1 F (36.7 C), temperature source Oral, resp. rate 16, height 5\' 10"  (1.778 m), weight 77 kg, SpO2 94 %. General appearance: alert, cooperative and no distress GI: soft, some distention, some tenderness in lower abdomen  Disposition:  Discharge disposition: 50-Hospice/Home       Discharge Instructions    Discharge patient   Complete by:  As directed    Discharge disposition:  50-Hospice/Home   Discharge patient date:  11/18/2018     Allergies as of 11/18/2018      Reactions   Azithromycin    Erythromycin Base    Other reaction(s): UNKNOWN   Metformin And Related Other (See Comments)   unknown      Medication List    STOP taking these medications   accu-chek multiclix lancets   ARTIFICIAL TEARS 0.1-0.3 % Soln Generic drug:  Dextran 70-Hypromellose   aspirin 81 MG EC tablet   atorvastatin 80 MG tablet Commonly known as:  LIPITOR   cefTRIAXone  IVPB Commonly known as:  ROCEPHIN   celecoxib 200 MG capsule Commonly known as:  CELEBREX   clopidogrel 75 MG tablet Commonly known as:  PLAVIX   cyanocobalamin 1000 MCG/ML injection Commonly known as:  (VITAMIN B-12)   finasteride 5 MG tablet Commonly known as:  PROSCAR   gabapentin 100 MG capsule Commonly known as:   NEURONTIN   glipiZIDE 5 MG tablet Commonly known as:  GLUCOTROL   HYDROcodone-acetaminophen 5-325 MG tablet Commonly known as:  NORCO/VICODIN   lisinopril 2.5 MG tablet Commonly known as:  PRINIVIL,ZESTRIL   metoprolol tartrate 25 MG tablet Commonly known as:  LOPRESSOR   metroNIDAZOLE 500 MG tablet Commonly known as:  FLAGYL   nitroGLYCERIN 0.4 MG SL tablet Commonly known as:  NITROSTAT   oxyCODONE-acetaminophen 10-325 MG tablet Commonly known as:  PERCOCET   PRESERVISION AREDS Caps   triamcinolone cream 0.1 % Commonly known as:  KENALOG     TAKE these medications   docusate sodium 100 MG capsule Commonly known as:  COLACE Take 1 capsule (100 mg total) by mouth 2 (two) times daily as needed for mild constipation.   glycopyrrolate 1 MG tablet Commonly known as:  ROBINUL Take 1 tablet (1 mg total) by mouth every 4 (four) hours as needed (excessive secretions).   HYDROmorphone 1 MG/ML injection Commonly known as:  DILAUDID Inject 0.5 mLs (0.5 mg total) into the vein every 2 (two) hours as needed for severe pain (or dyspnea).   LORazepam 1 MG tablet Commonly known as:  ATIVAN Take 1 tablet (1 mg total) by mouth every 4 (four) hours as needed for anxiety.   magnesium citrate Soln Take 148 mLs (0.5 Bottles total) by mouth once for 1 dose.   magnesium hydroxide 400 MG/5ML suspension Commonly known as:  MILK OF MAGNESIA Take 5 mLs by mouth daily as needed for moderate constipation.   ondansetron 4 MG disintegrating tablet Commonly known as:  ZOFRAN-ODT Take 1 tablet (4 mg total) by mouth every 6 (six) hours as needed for nausea.   oxyCODONE 20 MG/ML concentrated solution Commonly known as:  ROXICODONE INTENSOL Take 0.3 mLs (6 mg total) by mouth every 2 (two) hours as needed for moderate pain (or dyspnea).   simethicone 80 MG chewable tablet Commonly known as:  MYLICON Chew 1 tablet (80 mg total) by mouth 4 (four) times daily as needed (bloating).          Total time spent arranging discharge was >35min. Signed: Sung Amabile 11/18/2018, 8:52 AM

## 2018-11-18 NOTE — Progress Notes (Signed)
Follow up visit made to new hospice home referral. Patient seen sitting up in the recliner, expressed concern over continued constipation. He  received milk of magnesia yesterday evening and will receive magnesium citrate this morning. Patient up to the bedside commode with assistance of staff RN Malka. Patient required pain medication after moving from [facility] to bed. Writer spoke to patient's son Benjamin Parks, present this morning, he remains in agreement with plan for transfer to the hospice home today. Discharge summary faxed to referral, hospital care team updated. Signed DNR in place in discharge packet. Report called to the hospice home. EMS called for transport.  Dayna Barker BSN, RN, [facility] Liaison [facility] (formerly hosp[facility] [place]) [phone]

## 2018-11-30 ENCOUNTER — Inpatient Hospital Stay: Payer: Medicare Other | Admitting: Infectious Diseases

## 2018-12-15 DEATH — deceased

## 2020-04-17 IMAGING — CT CT IMAGE GUIDED DRAINAGE BY PERCUTANEOUS CATHETER
2 of 4 series · 13 of 32 positions shown, 18 images · non-contrast
Comparison: none

CLINICAL DATA: Acute sigmoid diverticulitis with worsening
associated diverticular abscess.

[Series 2: i-spiral 5.0 b30f · axial · 0.86mm/px · z∈[+1086,+1167]mm · 8 of 31 slices shown, 13 images (1 of 2)]
[im 4/31  soft-tissue]
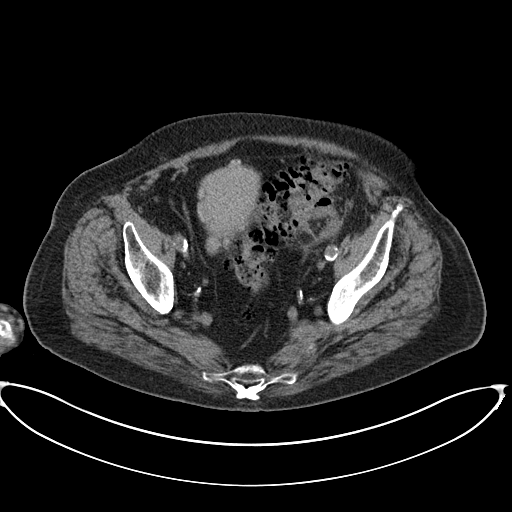
[im 4/31  bone]
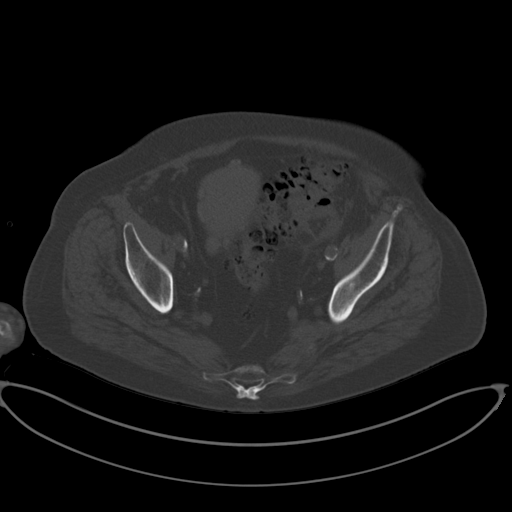
[im 7/31  soft-tissue]
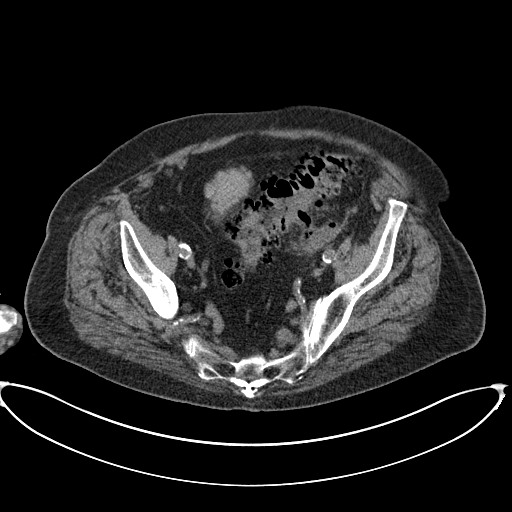
[im 11/31  soft-tissue]
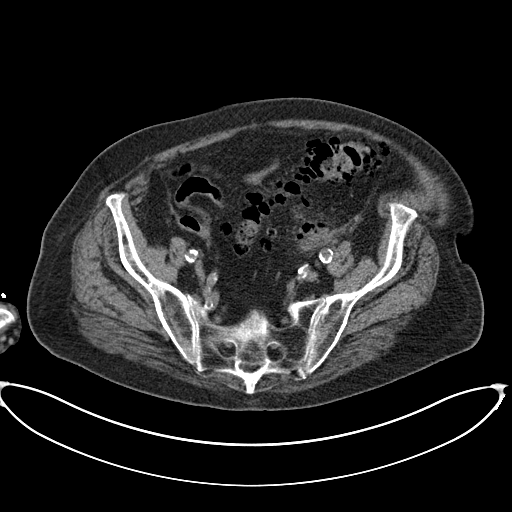
[im 14/31  soft-tissue]
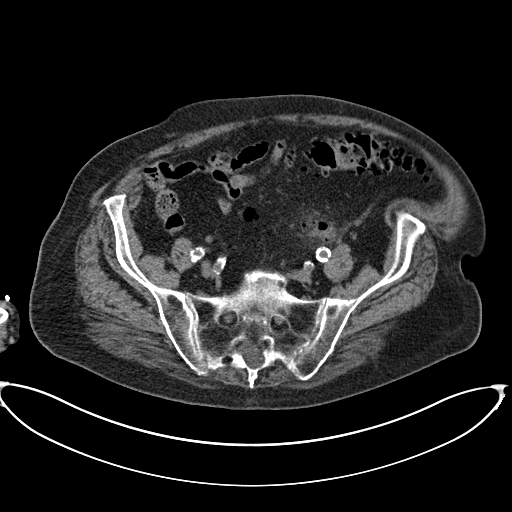
[im 17/31  soft-tissue]
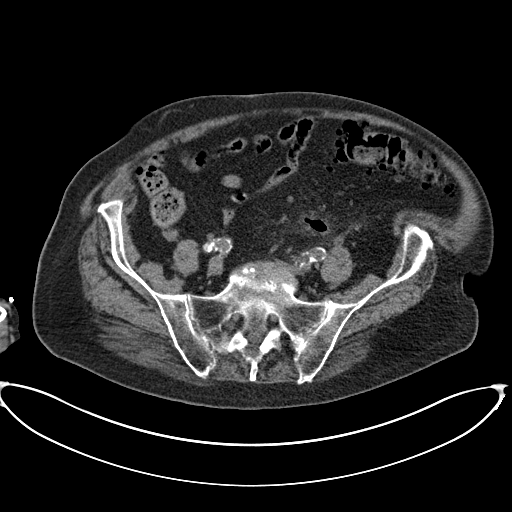
[im 17/31  lung]
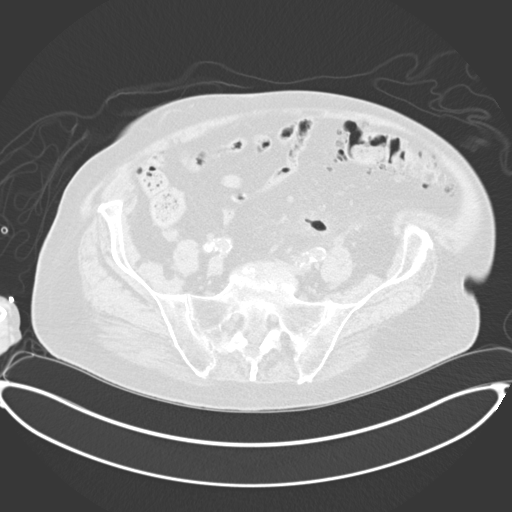
[im 21/31  soft-tissue]
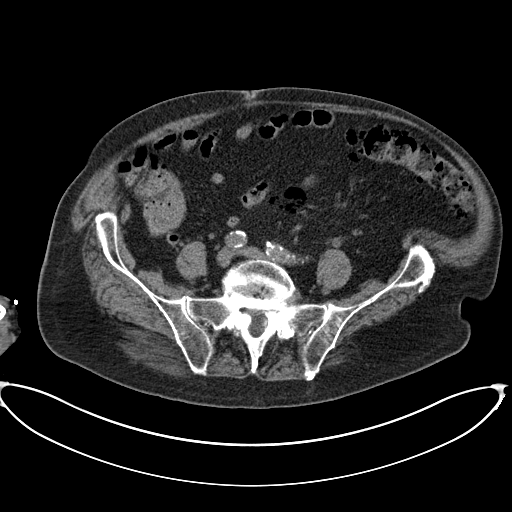
[im 21/31  lung]
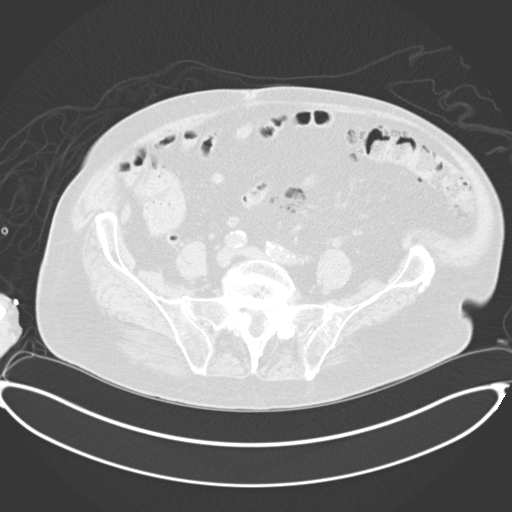
[im 24/31  soft-tissue]
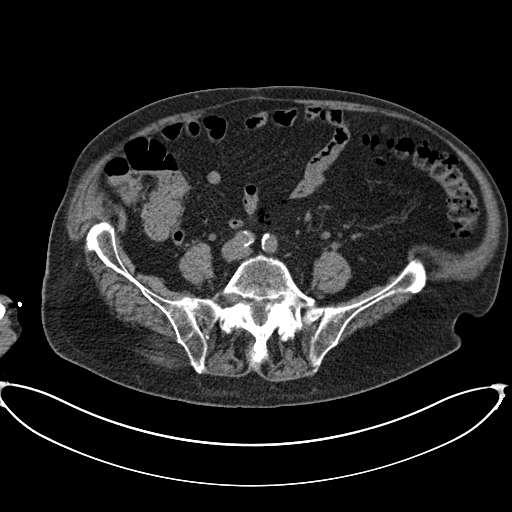
[im 24/31  lung]
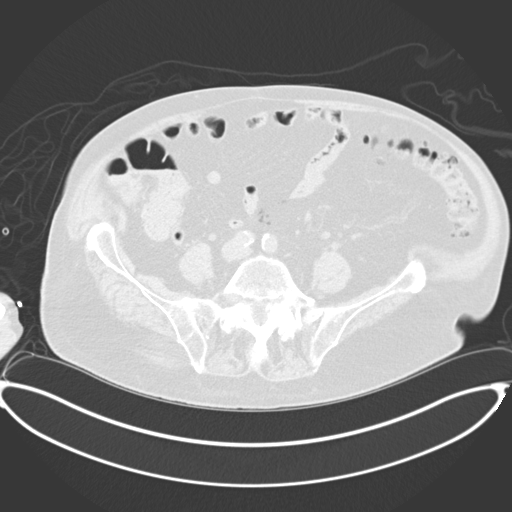
[im 27/31  soft-tissue]
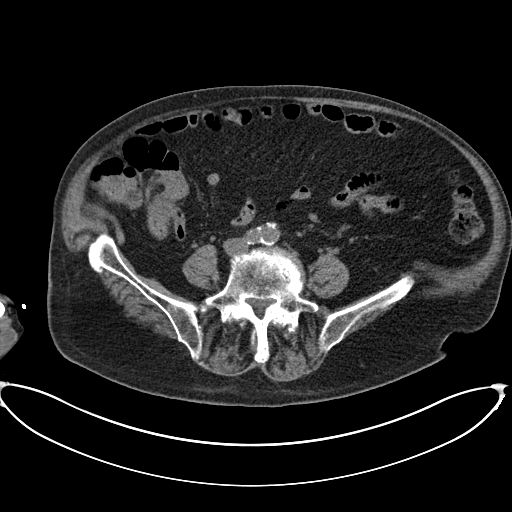
[im 27/31  lung]
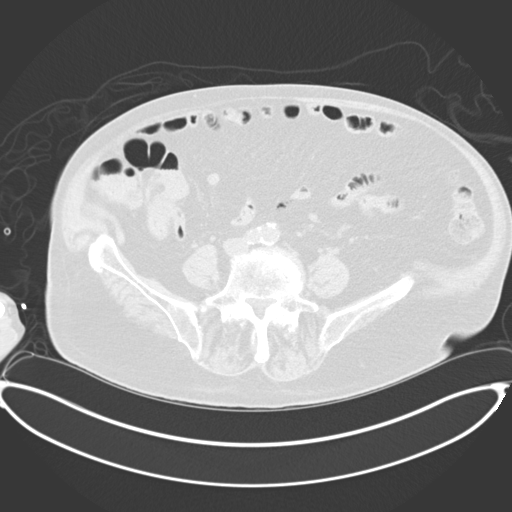

[Series 5: i-spiral 5.0 b30f · axial · 0.90mm/px · z∈[+1075,+1117]mm · 5 of 23 slices shown (2 of 2)]
[im 4/23  soft-tissue]
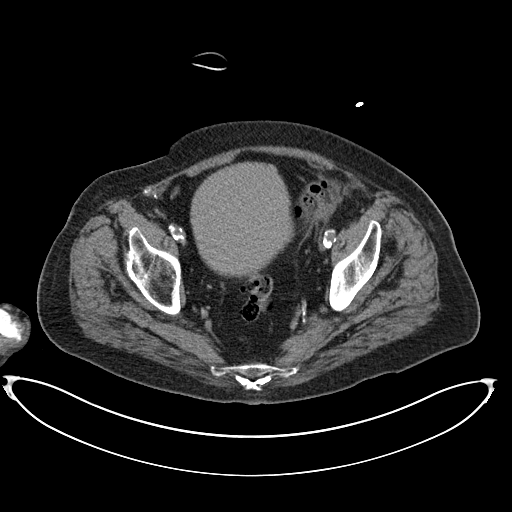
[im 7/23  soft-tissue]
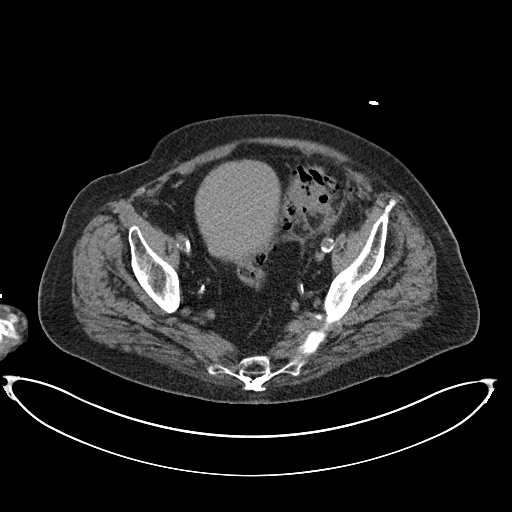
[im 10/23  soft-tissue]
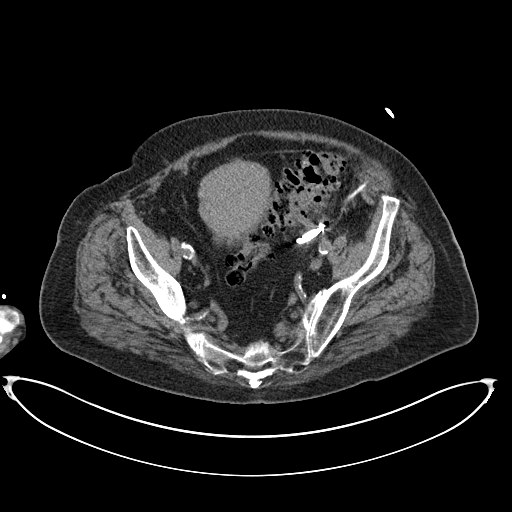
[im 13/23  soft-tissue]
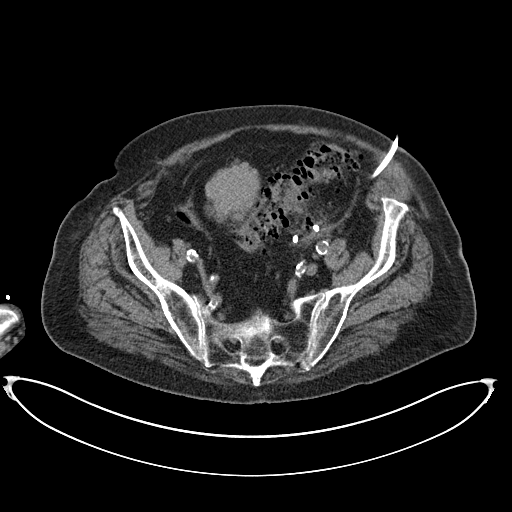
[im 16/23  soft-tissue]
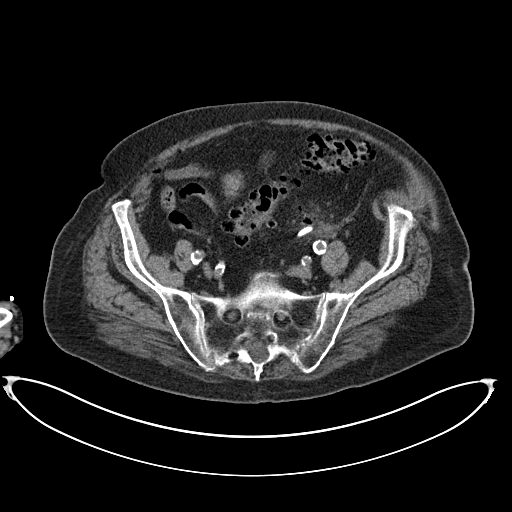

[13 of 32 positions shown; findings below may reference images not displayed]

EXAM:
CT GUIDED CATHETER DRAINAGE OF PERITONEAL DIVERTICULAR ABSCESS

ANESTHESIA/SEDATION:
1.0 mg IV Versed 50 mcg IV Fentanyl

Total Moderate Sedation Time:  32 minutes

The patient's level of consciousness and physiologic status were
continuously monitored during the procedure by Radiology nursing.

PROCEDURE:
The procedure, risks, benefits, and alternatives were explained to
the patient. Questions regarding the procedure were encouraged and
answered. The patient understands and consents to the procedure. A
time out was performed prior to initiating the procedure.

CT was performed through the pelvis in a supine position. The left
lower abdominal wall was prepped with chlorhexidine in a sterile
fashion, and a sterile drape was applied covering the operative
field. A sterile gown and sterile gloves were used for the
procedure. Local anesthesia was provided with 1% Lidocaine.

Under CT guidance, an 18 gauge trocar needle was advanced to the
level of a left lateral diverticular abscess. After confirming
needle tip position, aspiration was performed. A guidewire was
advanced into the collection and the needle removed. The
percutaneous tract was dilated and a 10 French percutaneous drainage
catheter advanced. Catheter position was confirmed by CT. Additional
fluid was aspirated and a sample sent for culture analysis. The
catheter was flushed with saline and connected to a suction bulb.
The catheter was secured at the skin with a Prolene retention suture
and StatLock device.

COMPLICATIONS:
None
FINDINGS: Collection lateral to the proximal sigmoid colon yielded purulent
and bloody fluid. A 10 French drainage catheter was formed in the
collection.
IMPRESSION: CT-guided percutaneous catheter drainage of sigmoid diverticular
abscess with placement of 10 French drainage catheter. A sample of
purulent and bloody fluid was sent for culture analysis. The
drainage catheter was attached to suction bulb drainage.
